# Patient Record
Sex: Female | Born: 1935 | Race: Black or African American | Hispanic: No | Smoking: Former smoker
Health system: Southern US, Community
[De-identification: ages and names within clinical notes are randomized; demographics above are authoritative.]

## PROBLEM LIST (undated history)

## (undated) DIAGNOSIS — Z8719 Personal history of other diseases of the digestive system: Secondary | ICD-10-CM

## (undated) DIAGNOSIS — F419 Anxiety disorder, unspecified: Secondary | ICD-10-CM

## (undated) DIAGNOSIS — E039 Hypothyroidism, unspecified: Secondary | ICD-10-CM

## (undated) DIAGNOSIS — I10 Essential (primary) hypertension: Secondary | ICD-10-CM

## (undated) DIAGNOSIS — R42 Dizziness and giddiness: Secondary | ICD-10-CM

## (undated) DIAGNOSIS — J449 Chronic obstructive pulmonary disease, unspecified: Secondary | ICD-10-CM

---

## 2001-02-11 ENCOUNTER — Emergency Department (HOSPITAL_COMMUNITY): Admission: EM | Admit: 2001-02-11 | Discharge: 2001-02-11 | Payer: Self-pay | Admitting: *Deleted

## 2001-02-11 ENCOUNTER — Encounter: Payer: Self-pay | Admitting: *Deleted

## 2002-06-28 ENCOUNTER — Emergency Department (HOSPITAL_COMMUNITY): Admission: EM | Admit: 2002-06-28 | Discharge: 2002-06-28 | Payer: Self-pay | Admitting: *Deleted

## 2011-10-04 ENCOUNTER — Encounter (HOSPITAL_COMMUNITY): Payer: Self-pay | Admitting: Pharmacy Technician

## 2011-10-05 NOTE — Patient Instructions (Addendum)
Your procedure is scheduled on:  10/11/2011  Report to [facility] at  6:15    AM.  Call this number if you have problems the morning of surgery: [phone]   Remember:   Do not drink or eat food:After Midnight.    Clear liquids include soda, tea, black coffee, apple or grape juice, broth.  Take these medicines the morning of surgery with A SIP OF WATER:levothyroxine, Amlodipine, Nexium, Hydralazine, lisinopril, and use Albuterol inhaler    Do not wear jewelry, make-up or nail polish.  Do not wear lotions, powders, or perfumes. You may wear deodorant.  Do not shave 48 hours prior to surgery.  Do not bring valuables to the hospital.  Contacts, dentures or bridgework may not be worn into surgery.  Leave suitcase in the car. After surgery it may be brought to your room.  For patients admitted to the hospital, checkout time is 11:00 AM the day of discharge.   Patients discharged the day of surgery will not be allowed to drive home.  Name and phone number of your driver:   Special Instructions: CHG Shower use special wash: 1/2 bottle night before surgery and 1/2 bottle morning of surgery.   Please read over the following fact sheets that you were given: Pain Booklet, MRSA  Surgical Site Infection Prevention, Anesthesia Post-op Instructions and Care and Recovery After Sur  gery  Carpal Tunnel Release (Repair) Care After Refer to this sheet in the next few weeks. These discharge instructions provide you with general information on caring for yourself after you leave the hospital. Your caregiver may also give you specific instructions. Your treatment has been planned according to the most current medical practices available, but unavoidable complications sometimes occur. If you have any problems or questions after discharge, please call your caregiver. HOME CARE INSTRUCTIONS   Have a responsible person with you for 24 hours.   Do not drive a car or take public transportation for 24 hours.    Only take over-the-counter or prescription medicines for pain, discomfort, or fever as directed by your caregiver. Take them as directed.   You may put ice on the palm side of the affected wrist.   Put ice in a plastic bag.   Place a towel between your skin and the bag.   Leave the ice on for 15 to 20 minutes, 3 to 4 times per day.   If you were given a splint to keep your wrist from bending, use it as directed. It is important to wear the splint at night or as directed. Use the splint for as long as you have pain or numbness in your hand, arm, or wrist. This may take 1 to 2 months.   Keep your hand raised (elevated) above the level of your heart as much as possible. This keeps swelling down and helps with discomfort.   Change bandages (dressings) as directed.   Keep the wound clean and dry.  SEEK MEDICAL CARE IF:   You develop pain not relieved with medicines.   You develop numbness of your hand.   You develop bleeding from your surgical site.   You have an oral temperature above 102 F (38.9 C).   You develop redness or swelling of the surgical site.   You develop new, unexplained problems.  SEEK IMMEDIATE MEDICAL CARE IF:   You develop a rash.   You have difficulty breathing.   You develop any reaction or side effects to medicines given.  MAKE SURE YOU:  Understand these instructions.   Will watch your condition.   Will get help right away if you are not doing well or get worse.  Document Released: 08/27/2004 Document Revised: 01/27/2011 Document Reviewed: 12/14/2006 [facility] Patient Information 2012 [place], [place].

## 2011-10-06 ENCOUNTER — Other Ambulatory Visit: Payer: Self-pay

## 2011-10-06 ENCOUNTER — Encounter (HOSPITAL_COMMUNITY)
Admission: RE | Admit: 2011-10-06 | Discharge: 2011-10-06 | Disposition: A | Discharge status: Home / Self Care | Payer: Medicare Other | Source: Ambulatory Visit | Attending: Orthopaedic Surgery | Admitting: Orthopaedic Surgery

## 2011-10-06 ENCOUNTER — Encounter (HOSPITAL_COMMUNITY): Payer: Self-pay

## 2011-10-06 HISTORY — DX: Essential (primary) hypertension: I10

## 2011-10-06 HISTORY — DX: Chronic obstructive pulmonary disease, unspecified: J44.9

## 2011-10-06 HISTORY — DX: Personal history of other diseases of the digestive system: Z87.19

## 2011-10-06 HISTORY — DX: Hypothyroidism, unspecified: E03.9

## 2011-10-06 HISTORY — DX: Anxiety disorder, unspecified: F41.9

## 2011-10-06 HISTORY — DX: Dizziness and giddiness: R42

## 2011-10-06 LAB — URINALYSIS, ROUTINE W REFLEX MICROSCOPIC
Bilirubin Urine: NEGATIVE
Glucose, UA: NEGATIVE mg/dL
Hgb urine dipstick: NEGATIVE
Specific Gravity, Urine: 1.01 (ref 1.005–1.030)
Urobilinogen, UA: 0.2 mg/dL (ref 0.0–1.0)

## 2011-10-06 LAB — COMPREHENSIVE METABOLIC PANEL
ALT: 12 U/L (ref 0–35)
Alkaline Phosphatase: 64 U/L (ref 39–117)
BUN: 9 mg/dL (ref 6–23)
CO2: 24 mEq/L (ref 19–32)
GFR calc Af Amer: 59 mL/min — ABNORMAL LOW (ref >90)
GFR calc non Af Amer: 51 mL/min — ABNORMAL LOW (ref >90)
Glucose, Bld: 131 mg/dL — ABNORMAL HIGH (ref 70–99)
Potassium: 3.9 mEq/L (ref 3.5–5.1)
Sodium: 134 mEq/L — ABNORMAL LOW (ref 135–145)
Total Bilirubin: 0.3 mg/dL (ref 0.3–1.2)
Total Protein: 7.4 g/dL (ref 6.0–8.3)

## 2011-10-06 LAB — URINE MICROSCOPIC-ADD ON

## 2011-10-06 LAB — CBC WITH DIFFERENTIAL/PLATELET
Basophils Absolute: 0 10*3/uL (ref 0.0–0.1)
Eosinophils Relative: 2 % (ref 0–5)
HCT: 38.4 % (ref 36.0–46.0)
Lymphs Abs: 2.5 10*3/uL (ref 0.7–4.0)
MCH: 30.7 pg (ref 26.0–34.0)
MCV: 88.7 fL (ref 78.0–100.0)
Monocytes Absolute: 0.8 10*3/uL (ref 0.1–1.0)
Neutro Abs: 7.7 10*3/uL (ref 1.7–7.7)
Platelets: 271 10*3/uL (ref 150–400)
RDW: 14.1 % (ref 11.5–15.5)

## 2011-10-10 ENCOUNTER — Encounter (HOSPITAL_COMMUNITY): Payer: Self-pay | Admitting: Orthopaedic Surgery

## 2011-10-10 MED ORDER — SODIUM CHLORIDE 0.9 % IJ SOLN
INTRAMUSCULAR | Status: AC
  Filled 2011-10-10: qty 10

## 2011-10-10 NOTE — H&P (Signed)
Katie [name] is an 78 y.o. female.   Chief Complaint: My hand goes numb. HPI: She has had numbness in both hands for years getting worse.  I saw her in the office in June for a small nondisplaced fracture of the left thumb tip.  It has healed well.  She has numbness of both hand in the median nerve distribution with pain more at night.  She had EMGs done in [facility] 09/28/11 showing bilateral carpal tunnel syndrome.  She has more pain on the left side.  I have recommended release of the carpal tunnel.  She asked appropriate questions and is agreeable to the elective procedure.  I went over risks and imponderables with her.  Past Medical History  Diagnosis Date  . COPD (chronic obstructive pulmonary disease)   . Anxiety   . Hypertension   . Hypothyroidism   . H/O hiatal hernia   . Vertigo     No past surgical history on file.  No family history on file. Social History:  reports that she quit smoking about 19 years ago. Her smoking use included Cigarettes. She has a 5 pack-year smoking history. She does not have any smokeless tobacco history on file. She reports that she does not drink alcohol or use illicit drugs.  Allergies: No Known Allergies  No prescriptions prior to admission    No results found for this or any previous visit (from the past 48 hour(s)). No results found.  Review of Systems  Constitutional: Negative.   HENT: Negative.   Eyes: Negative.   Respiratory:       History of COPD  Cardiovascular:       History of hypertension  Gastrointestinal: Negative.   Genitourinary: Negative.   Musculoskeletal: Positive for joint pain (pain both hand with numbness, more on the left).  Neurological: Negative.   Endo/Heme/Allergies:       History of hypothyroid.  Psychiatric/Behavioral: Negative.     There were no vitals taken for this visit. Physical Exam  Constitutional: She is oriented to person, place, and time. She appears well-developed and well-nourished.    HENT:  Head: Normocephalic and atraumatic.  Eyes: EOM are normal. Pupils are equal, round, and reactive to light.  Neck: Normal range of motion. Neck supple.  Cardiovascular: Normal rate, regular rhythm, normal heart sounds and intact distal pulses.   Respiratory: Effort normal and breath sounds normal.  GI: Soft. Bowel sounds are normal.  Musculoskeletal: She exhibits tenderness (Pain both wrists with positive Tinel and Phalen signs bilaterally).       Left hand: She exhibits tenderness. decreased sensation noted. Decreased sensation is present in the medial distribution.       Hands: Neurological: She is alert and oriented to person, place, and time. She has normal reflexes.  Skin: Skin is warm and dry.  Psychiatric: She has a normal mood and affect. Her behavior is normal. Judgment and thought content normal.     Assessment/Plan Carpal tunnel syndrome, bilaterally.  To do left side carpal tunnel release as an outpatient.  Chauncey Sciulli 10/10/2011, 11:44 AM

## 2011-10-11 ENCOUNTER — Ambulatory Visit (HOSPITAL_COMMUNITY): Payer: Medicare Other | Admitting: Anesthesiology

## 2011-10-11 ENCOUNTER — Encounter (HOSPITAL_COMMUNITY)
Admission: RE | Disposition: A | Discharge status: Home / Self Care | Payer: Self-pay | Source: Ambulatory Visit | Attending: Orthopaedic Surgery

## 2011-10-11 ENCOUNTER — Encounter (HOSPITAL_COMMUNITY): Payer: Self-pay | Admitting: Anesthesiology

## 2011-10-11 ENCOUNTER — Encounter (HOSPITAL_COMMUNITY): Payer: Self-pay | Admitting: *Deleted

## 2011-10-11 ENCOUNTER — Ambulatory Visit (HOSPITAL_COMMUNITY)
Admission: RE | Admit: 2011-10-11 | Discharge: 2011-10-11 | Disposition: A | Discharge status: Home / Self Care | Payer: Medicare Other | Source: Ambulatory Visit | Attending: Orthopaedic Surgery | Admitting: Orthopaedic Surgery

## 2011-10-11 DIAGNOSIS — J4489 Other specified chronic obstructive pulmonary disease: Secondary | ICD-10-CM | POA: Insufficient documentation

## 2011-10-11 DIAGNOSIS — I10 Essential (primary) hypertension: Secondary | ICD-10-CM | POA: Insufficient documentation

## 2011-10-11 DIAGNOSIS — Z01810 Encounter for preprocedural cardiovascular examination: Secondary | ICD-10-CM | POA: Insufficient documentation

## 2011-10-11 DIAGNOSIS — G5600 Carpal tunnel syndrome, unspecified upper limb: Secondary | ICD-10-CM | POA: Insufficient documentation

## 2011-10-11 DIAGNOSIS — J449 Chronic obstructive pulmonary disease, unspecified: Secondary | ICD-10-CM | POA: Insufficient documentation

## 2011-10-11 HISTORY — PX: CARPAL TUNNEL RELEASE: SHX101

## 2011-10-11 SURGERY — CARPAL TUNNEL RELEASE
Anesthesia: Regional | Site: Wrist | Laterality: Left | Wound class: Clean

## 2011-10-11 MED ORDER — MIDAZOLAM HCL 2 MG/2ML IJ SOLN
1.0000 mg | INTRAMUSCULAR | Status: DC | PRN
  Administered 2011-10-11: 2 mg via INTRAVENOUS

## 2011-10-11 MED ORDER — ONDANSETRON HCL 4 MG/2ML IJ SOLN: 4.0000 mg | Freq: Once | INTRAMUSCULAR | Status: DC | PRN

## 2011-10-11 MED ORDER — FENTANYL CITRATE 0.05 MG/ML IJ SOLN
INTRAMUSCULAR | Status: AC
  Filled 2011-10-11: qty 2

## 2011-10-11 MED ORDER — LIDOCAINE HCL (PF) 1 % IJ SOLN
INTRAMUSCULAR | Status: AC
  Filled 2011-10-11: qty 5

## 2011-10-11 MED ORDER — LIDOCAINE HCL (PF) 0.5 % IJ SOLN: INTRAMUSCULAR | Status: DC | PRN

## 2011-10-11 MED ORDER — TRAMADOL HCL 50 MG PO TABS: 50.0000 mg | ORAL_TABLET | Freq: Four times a day (QID) | ORAL | Status: AC | PRN

## 2011-10-11 MED ORDER — SODIUM CHLORIDE 0.9 % IR SOLN
Status: DC | PRN
  Administered 2011-10-11: 1000 mL

## 2011-10-11 MED ORDER — FENTANYL CITRATE 0.05 MG/ML IJ SOLN
INTRAMUSCULAR | Status: DC | PRN
  Administered 2011-10-11 (×4): 25 ug via INTRAVENOUS

## 2011-10-11 MED ORDER — SODIUM CHLORIDE 0.9 % IJ SOLN
INTRAMUSCULAR | Status: DC | PRN
  Administered 2011-10-11: 2 mL via INTRAVENOUS

## 2011-10-11 MED ORDER — PROPOFOL 10 MG/ML IV EMUL
INTRAVENOUS | Status: DC | PRN
  Administered 2011-10-11: 35 ug/kg/min via INTRAVENOUS

## 2011-10-11 MED ORDER — PROPOFOL 10 MG/ML IV EMUL
INTRAVENOUS | Status: AC
  Filled 2011-10-11: qty 20

## 2011-10-11 MED ORDER — MIDAZOLAM HCL 2 MG/2ML IJ SOLN
INTRAMUSCULAR | Status: AC
  Filled 2011-10-11: qty 2

## 2011-10-11 MED ORDER — LACTATED RINGERS IV SOLN: INTRAVENOUS | Status: DC

## 2011-10-11 MED ORDER — LACTATED RINGERS IV SOLN
INTRAVENOUS | Status: DC
  Administered 2011-10-11: 1000 mL via INTRAVENOUS

## 2011-10-11 MED ORDER — LIDOCAINE HCL (PF) 0.5 % IJ SOLN
INTRAMUSCULAR | Status: DC | PRN
  Administered 2011-10-11: 250 mg via INTRAVENOUS

## 2011-10-11 MED ORDER — FENTANYL CITRATE 0.05 MG/ML IJ SOLN: 25.0000 ug | INTRAMUSCULAR | Status: DC | PRN

## 2011-10-11 MED ORDER — LIDOCAINE HCL (PF) 0.5 % IJ SOLN
INTRAMUSCULAR | Status: AC
  Filled 2011-10-11: qty 50

## 2011-10-11 SURGICAL SUPPLY — 37 items
BAG HAMPER (MISCELLANEOUS) ×2 IMPLANT
BANDAGE ELASTIC 3 VELCRO NS (GAUZE/BANDAGES/DRESSINGS) ×2 IMPLANT
BANDAGE ESMARK 4X12 BL STRL LF (DISPOSABLE) ×1 IMPLANT
BLADE SURG 15 STRL LF DISP TIS (BLADE) ×1 IMPLANT
BLADE SURG 15 STRL SS (BLADE) ×1
BNDG ESMARK 4X12 BLUE STRL LF (DISPOSABLE) ×2
CLOTH BEACON ORANGE TIMEOUT ST (SAFETY) ×2 IMPLANT
COVER LIGHT HANDLE STERIS (MISCELLANEOUS) ×4 IMPLANT
CUFF TOURNIQUET SINGLE 18IN (TOURNIQUET CUFF) ×2 IMPLANT
DRSG XEROFORM 1X8 (GAUZE/BANDAGES/DRESSINGS) ×4 IMPLANT
DURAPREP 26ML APPLICATOR (WOUND CARE) ×2 IMPLANT
ELECT NEEDLE TIP 2.8 STRL (NEEDLE) ×2 IMPLANT
ELECT REM PT RETURN 9FT ADLT (ELECTROSURGICAL) ×2
ELECTRODE REM PT RTRN 9FT ADLT (ELECTROSURGICAL) ×1 IMPLANT
FORMALIN 10 PREFIL 120ML (MISCELLANEOUS) ×2 IMPLANT
GLOVE BIO SURGEON STRL SZ8 (GLOVE) ×2 IMPLANT
GLOVE BIO SURGEON STRL SZ8.5 (GLOVE) ×2 IMPLANT
GLOVE BIOGEL PI IND STRL 7.0 (GLOVE) ×1 IMPLANT
GLOVE BIOGEL PI INDICATOR 7.0 (GLOVE) ×1
GLOVE EXAM NITRILE MD LF STRL (GLOVE) ×2 IMPLANT
GLOVE SS BIOGEL STRL SZ 6.5 (GLOVE) ×1 IMPLANT
GLOVE SUPERSENSE BIOGEL SZ 6.5 (GLOVE) ×1
GOWN STRL REIN XL XLG (GOWN DISPOSABLE) ×4 IMPLANT
KIT ROOM TURNOVER APOR (KITS) ×2 IMPLANT
NEEDLE HYPO 18GX1.5 BLUNT FILL (NEEDLE) ×2 IMPLANT
NEEDLE HYPO 27GX1-1/4 (NEEDLE) ×2 IMPLANT
NS IRRIG 1000ML POUR BTL (IV SOLUTION) ×2 IMPLANT
PACK BASIC LIMB (CUSTOM PROCEDURE TRAY) ×2 IMPLANT
PAD ARMBOARD 7.5X6 YLW CONV (MISCELLANEOUS) ×2 IMPLANT
PAD CAST 3X4 CTTN HI CHSV (CAST SUPPLIES) ×1 IMPLANT
PADDING CAST COTTON 3X4 STRL (CAST SUPPLIES) ×1
SET BASIN LINEN APH (SET/KITS/TRAYS/PACK) ×2 IMPLANT
SPONGE GAUZE 4X4 12PLY (GAUZE/BANDAGES/DRESSINGS) ×2 IMPLANT
SUT ETHILON 3 0 FSL (SUTURE) ×2 IMPLANT
SYR 3ML LL SCALE MARK (SYRINGE) ×2 IMPLANT
TOWEL OR 17X26 4PK STRL BLUE (TOWEL DISPOSABLE) IMPLANT
VESSEL LOOPS MAXI RED (MISCELLANEOUS) ×2 IMPLANT

## 2011-10-11 NOTE — Brief Op Note (Signed)
10/11/2011  8:11 AM  PATIENT:  Katie [name]  78 y.o. female  PRE-OPERATIVE DIAGNOSIS:  carpal tunnel syndrome left wrist  POST-OPERATIVE DIAGNOSIS:  carpal tunnel syndrome left wrist  PROCEDURE:  Procedure(s) (LRB): CARPAL TUNNEL RELEASE (Left)  SURGEON:  Surgeon(s) and Role:    * Darreld Mclean, MD - Primary  PHYSICIAN ASSISTANT:   ASSISTANTS: none   ANESTHESIA:   regional  EBL:  Total I/O In: 600 [I.V.:600] Out: -   BLOOD ADMINISTERED:none  DRAINS: none   LOCAL MEDICATIONS USED:  NONE  SPECIMEN:  Source of Specimen:  left volar carpal ligament  DISPOSITION OF SPECIMEN:  PATHOLOGY  COUNTS:  YES  TOURNIQUET:   Total Tourniquet Time Documented: Upper Arm (Left) - 30 minutes  DICTATION: .Other Dictation: Dictation Number [id number]  PLAN OF CARE: Discharge to home after PACU  PATIENT DISPOSITION:  PACU - hemodynamically stable.   Delay start of Pharmacological VTE agent (>24hrs) due to surgical blood loss or risk of bleeding: not applicable

## 2011-10-11 NOTE — Progress Notes (Signed)
The History and Physical is unchanged. I have examined the patient. The patient is medically able to have surgery on the left wrist for carpal tunnel . Katie [name]

## 2011-10-11 NOTE — Anesthesia Postprocedure Evaluation (Signed)
  Anesthesia Post-op Note  Patient: Katie [name]  Procedure(s) Performed: Procedure(s) (LRB): CARPAL TUNNEL RELEASE (Left)  Patient Location: PACU  Anesthesia Type: Bier block  Level of Consciousness: awake, alert , oriented and patient cooperative  Airway and Oxygen Therapy: Patient Spontanous Breathing  Post-op Pain: none  Post-op Assessment: Post-op Vital signs reviewed, Patient's Cardiovascular Status Stable, Respiratory Function Stable, Patent Airway, No signs of Nausea or vomiting and Pain level controlled  Post-op Vital Signs: Reviewed and stable  Complications: No apparent anesthesia complications

## 2011-10-11 NOTE — Anesthesia Preprocedure Evaluation (Signed)
Anesthesia Evaluation  Patient identified by MRN, date of birth, ID band Patient awake    Reviewed: Allergy & Precautions, H&P , NPO status , Patient's Chart, lab work & pertinent test results  Airway Mallampati: II      Dental  (+) Edentulous Upper   Pulmonary COPD COPD inhaler, former smoker,  breath sounds clear to auscultation        Cardiovascular hypertension, Pt. on medications Rhythm:Regular     Neuro/Psych Anxiety    GI/Hepatic hiatal hernia,   Endo/Other  Hypothyroidism   Renal/GU      Musculoskeletal   Abdominal   Peds  Hematology   Anesthesia Other Findings   Reproductive/Obstetrics                           Anesthesia Physical Anesthesia Plan  ASA: III  Anesthesia Plan: Bier Block   Post-op Pain Management:    Induction: Intravenous  Airway Management Planned: Nasal Cannula  Additional Equipment:   Intra-op Plan:   Post-operative Plan:   Informed Consent: I have reviewed the patients History and Physical, chart, labs and discussed the procedure including the risks, benefits and alternatives for the proposed anesthesia with the patient or authorized representative who has indicated his/her understanding and acceptance.     Plan Discussed with:   Anesthesia Plan Comments:         Anesthesia Quick Evaluation

## 2011-10-11 NOTE — Anesthesia Procedure Notes (Signed)
Anesthesia Regional Block:  Bier block (IV Regional)  Pre-Anesthetic Checklist: ,,, Correct Patient, Correct Site, Correct Laterality, Correct Procedure, Correct Position, site marked, risks and benefits discussed, Surgical consent, Pre-op evaluation  Laterality: Left  Prep: Betadine       Needles:  Injection technique: Single-shot      Additional Needles:  Motor weakness within 2 minutes. Bier block (IV Regional) Narrative:  Resident/CRNA: R. Jaivon Vanbeek,crna

## 2011-10-11 NOTE — Transfer of Care (Signed)
Immediate Anesthesia Transfer of Care Note  Patient: Katie [name]  Procedure(s) Performed: Procedure(s) (LRB): CARPAL TUNNEL RELEASE (Left)  Patient Location: PACU  Anesthesia Type: Bier block  Level of Consciousness: awake and patient cooperative  Airway & Oxygen Therapy: Patient Spontanous Breathing and Patient connected to face mask oxygen  Post-op Assessment: Report given to PACU RN, Post -op Vital signs reviewed and stable and Patient moving all extremities  Post vital signs: Reviewed and stable  Complications: No apparent anesthesia complications

## 2011-10-11 NOTE — Addendum Note (Signed)
Addendum  created 10/11/11 0948 by Despina Hidden, CRNA   Modules edited:Anesthesia Medication Administration

## 2011-10-12 NOTE — Op Note (Signed)
NAME:  Katie, [name] NO.:  [id number]  MEDICAL RECORD NO.:  [id number]  LOCATION:  [facility]                          FACILITY:  [facility]  PHYSICIAN:  J. Darreld Mclean, M.D. DATE OF BIRTH:  1924/07/18  DATE OF PROCEDURE: DATE OF DISCHARGE:  10/11/2011                              OPERATIVE REPORT   PREOPERATIVE DIAGNOSIS:  Carpal tunnel syndrome, left.  POSTOPERATIVE DIAGNOSIS:  Carpal tunnel syndrome, left.  PROCEDURE:  Open release of volar carpal ligament, left; saline neurolysis, aponeurotomy, left median nerve.  ANESTHESIA:  Regional Bier block.  Please refer to anesthesia record for tourniquet time.  SURGEON:  J. Darreld Mclean, M.D.  DRAINS:  No drains.  SPLINTS:  No splint.  INDICATIONS:  The patient is an 78 year old female with pain and tenderness in both hands with weakness and numbness in both hands and median nerve distribution.  She has had EMG showing bilateral carpal tunnel.  Her symptoms are worse on the left and on the right.  She has not improve with conservative treatment.  I recommended surgical release.  I went over the risks and imponderables with her and she appears to understand and agrees to procedure as outlined.  PROCEDURE:  The patient was seen in the holding area and identified the left wrist as correct surgical site.  I placed a mark on the volar left wrist over the site of the planned incision.  She was brought to the operating room and placed supine.  She had Bier block regional anesthesia given.  She was prepped and draped in usual manner.  A generalized time-out, identifying [name] as correct patient care.  We are doing a left wrist carpal tunnel release.  All instrumentation were properly positioned and working.  The OR team knew each other.  Outline for incision was made with careful dissection, the median nerve was identified proximally.  The vessel loop placed was around the nerve. Then using a groove  director was placed on the carpal tunnel space and volar carpal ligament was incised.  Ligament was released retinaculum proximally.  Saline neurolysis aponeurotomy was carried to the nerve was markedly compressed.  There was suspected no apparent injury.  The wounds were reapproximated with using 3-0 nylon in interrupted vertical mattress manner.  Sterile dressing applied.  Bulky dressing applied. Sheet cotton applied.  Sheet cotton cut dorsally.  ACE bandage applied loosely.  The patient tolerated the procedure well.  She will go to the recovery in good condition.  Appropriate analgesia was given for pain. I will see her in the office in 1 week.  If any difficulties, should contact me through the office hospital beeper system.          ______________________________ Shela Commons. Darreld Mclean, M.D.     JWK/MEDQ  D:  10/11/2011  T:  10/12/2011  Job:  [id number]

## 2011-10-14 ENCOUNTER — Encounter (HOSPITAL_COMMUNITY): Payer: Self-pay | Admitting: Orthopaedic Surgery

## 2017-02-03 ENCOUNTER — Other Ambulatory Visit: Payer: Self-pay | Admitting: Ophthalmology

## 2017-02-03 ENCOUNTER — Other Ambulatory Visit
Admission: RE | Admit: 2017-02-03 | Discharge: 2017-02-03 | Disposition: A | Discharge status: Home / Self Care | Payer: Medicare Other | Source: Ambulatory Visit | Attending: Orthopedic Surgery | Admitting: Orthopedic Surgery

## 2017-02-03 DIAGNOSIS — H3411 Central retinal artery occlusion, right eye: Secondary | ICD-10-CM | POA: Insufficient documentation

## 2017-02-03 LAB — CBC WITH DIFFERENTIAL/PLATELET
BASOS PCT: 0 %
Basophils Absolute: 0 10*3/uL (ref 0–0.1)
EOS ABS: 0.1 10*3/uL (ref 0–0.7)
Eosinophils Relative: 1 %
HCT: 38.2 % (ref 35.0–47.0)
HEMOGLOBIN: 12.5 g/dL (ref 12.0–16.0)
Lymphocytes Relative: 11 %
Lymphs Abs: 1.2 10*3/uL (ref 1.0–3.6)
MCH: 29.5 pg (ref 26.0–34.0)
MCHC: 32.8 g/dL (ref 32.0–36.0)
MCV: 89.9 fL (ref 80.0–100.0)
MONOS PCT: 8 %
Monocytes Absolute: 0.8 10*3/uL (ref 0.2–0.9)
NEUTROS PCT: 80 %
Neutro Abs: 8.7 10*3/uL — ABNORMAL HIGH (ref 1.4–6.5)
PLATELETS: 232 10*3/uL (ref 150–440)
RBC: 4.25 MIL/uL (ref 3.80–5.20)
RDW: 16.2 % — ABNORMAL HIGH (ref 11.5–14.5)
WBC: 10.8 10*3/uL (ref 3.6–11.0)

## 2017-02-03 LAB — C-REACTIVE PROTEIN: CRP: 2.7 mg/dL — AB (ref <1.0)

## 2017-02-03 LAB — SEDIMENTATION RATE: SED RATE: 82 mm/h — AB (ref 0–30)

## 2017-02-06 ENCOUNTER — Ambulatory Visit
Admission: RE | Admit: 2017-02-06 | Discharge: 2017-02-06 | Disposition: A | Discharge status: Home / Self Care | Payer: Medicare Other | Source: Ambulatory Visit | Attending: Ophthalmology | Admitting: Ophthalmology

## 2017-02-06 DIAGNOSIS — I6523 Occlusion and stenosis of bilateral carotid arteries: Secondary | ICD-10-CM | POA: Insufficient documentation

## 2017-02-06 DIAGNOSIS — H3411 Central retinal artery occlusion, right eye: Secondary | ICD-10-CM | POA: Diagnosis not present

## 2017-02-23 ENCOUNTER — Encounter (INDEPENDENT_AMBULATORY_CARE_PROVIDER_SITE_OTHER): Payer: Self-pay

## 2017-02-23 ENCOUNTER — Ambulatory Visit (INDEPENDENT_AMBULATORY_CARE_PROVIDER_SITE_OTHER): Payer: Medicare Other | Admitting: Vascular Surgery

## 2017-02-23 ENCOUNTER — Encounter (INDEPENDENT_AMBULATORY_CARE_PROVIDER_SITE_OTHER): Payer: Self-pay | Admitting: Vascular Surgery

## 2017-02-23 DIAGNOSIS — M316 Other giant cell arteritis: Secondary | ICD-10-CM | POA: Insufficient documentation

## 2017-02-23 DIAGNOSIS — J449 Chronic obstructive pulmonary disease, unspecified: Secondary | ICD-10-CM | POA: Diagnosis not present

## 2017-02-23 DIAGNOSIS — H5440 Blindness, one eye, unspecified eye: Secondary | ICD-10-CM

## 2017-02-23 DIAGNOSIS — I6523 Occlusion and stenosis of bilateral carotid arteries: Secondary | ICD-10-CM | POA: Diagnosis not present

## 2017-02-23 DIAGNOSIS — I10 Essential (primary) hypertension: Secondary | ICD-10-CM

## 2017-02-23 DIAGNOSIS — I6529 Occlusion and stenosis of unspecified carotid artery: Secondary | ICD-10-CM | POA: Insufficient documentation

## 2017-02-24 ENCOUNTER — Encounter (INDEPENDENT_AMBULATORY_CARE_PROVIDER_SITE_OTHER): Payer: Self-pay | Admitting: Vascular Surgery

## 2017-02-24 DIAGNOSIS — I10 Essential (primary) hypertension: Secondary | ICD-10-CM | POA: Insufficient documentation

## 2017-02-24 DIAGNOSIS — J449 Chronic obstructive pulmonary disease, unspecified: Secondary | ICD-10-CM | POA: Insufficient documentation

## 2017-02-24 NOTE — Progress Notes (Signed)
MRN : [id number]  Curt BearsGirtherene W Kuzel is a 84 y.o. (July 26, 1924) female who presents with chief complaint of  Chief Complaint  Patient presents with  . New Patient (Initial Visit)    ref [place] for CAS  .  History of Present Illness: The patient is seen for evaluation of carotid stenosis. The carotid stenosis was identified after he was evaluated at [facility].  The patient experienced an acute onset of loss of vision in the right eye.  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  This then prompted a duplex ultrasound which demonstrates moderate to possible severe atherosclerotic occlusive disease of the carotid arteries.  CRP and sedimentation rate were also ordered and these were both elevated.  The patient denies past episodes of amaurosis fugax. There is no recent history of TIA symptoms or focal motor deficits. There is no prior documented CVA.  There is no history of migraine headaches. There is no history of seizures.  The patient is taking enteric-coated aspirin 81 mg daily.  The patient has a history of coronary artery disease, no recent episodes of angina or shortness of breath. The patient denies PAD or claudication symptoms. There is a history of hyperlipidemia which is being treated with a statin.    Current Meds  Medication Sig  . acetaminophen (TYLENOL) 500 MG tablet Take 500 mg by mouth every 6 (six) hours as needed. For pain  . albuterol (PROVENTIL HFA;VENTOLIN HFA) 108 (90 BASE) MCG/ACT inhaler Inhale 2 puffs into the lungs every 6 (six) hours as needed. For shortness of breath  . amLODipine (NORVASC) 10 MG tablet Take 10 mg by mouth daily.  Marland Kitchen. aspirin EC 81 MG tablet Take 325 mg by mouth daily.   . diazepam (VALIUM) 5 MG tablet Take 2.5 mg by mouth at bedtime as needed. For sleep  . esomeprazole (NEXIUM) 40 MG capsule Take 40 mg by mouth daily before breakfast.  . furosemide (LASIX) 20 MG tablet Take 10 mg by mouth  daily.  . hydrALAZINE (APRESOLINE) 10 MG tablet Take 10 mg by mouth daily.  . hydrochlorothiazide (HYDRODIURIL) 25 MG tablet Take 25 mg by mouth daily.  Marland Kitchen. levothyroxine (SYNTHROID, LEVOTHROID) 75 MCG tablet Take 75 mcg by mouth daily.  Marland Kitchen. lisinopril (PRINIVIL,ZESTRIL) 20 MG tablet Take 10 mg by mouth daily.  . meclizine (ANTIVERT) 25 MG tablet Take 25 mg by mouth 3 (three) times daily as needed. For inner ear    Past Medical History:  Diagnosis Date  . Anxiety   . COPD (chronic obstructive pulmonary disease) (HCC)   . H/O hiatal hernia   . Hypertension   . Hypothyroidism   . Vertigo     Past Surgical History:  Procedure Laterality Date  . CARPAL TUNNEL RELEASE  10/11/2011   Procedure: CARPAL TUNNEL RELEASE;  Surgeon: Darreld McleanWayne Keeling, MD;  Location: [facility];  Service: Orthopedics;  Laterality: Left;    Social History Social History   Tobacco Use  . Smoking status: Former Smoker    Packs/day: 0.50    Years: 10.00    Pack years: 5.00    Types: Cigarettes    Last attempt to quit: 10/05/1992    Years since quitting: 24.4  . Smokeless tobacco: Never Used  Substance Use Topics  . Alcohol use: No  . Drug use: No    Family History Family History  Problem Relation Age of Onset  . Hypertension Daughter   . Hypertension Son   No family history  of bleeding/clotting disorders, porphyria or autoimmune disease   No Known Allergies   REVIEW OF SYSTEMS (Negative unless checked)  Constitutional: [] Weight loss  [] Fever  [] Chills Cardiac: [] Chest pain   [] Chest pressure   [] Palpitations   [] Shortness of breath when laying flat   [] Shortness of breath with exertion. Vascular:  [] Pain in legs with walking   [] Pain in legs at rest  [] History of DVT   [] Phlebitis   [] Swelling in legs   [] Varicose veins   [] Non-healing ulcers Pulmonary:   [] Uses home oxygen   [] Productive cough   [] Hemoptysis   [] Wheeze  [x] COPD   [] Asthma Neurologic:  [] Dizziness   [] Seizures   [] History of stroke    [] History of TIA  [] Aphasia   [x] Vissual changes   [] Weakness or numbness in arm   [] Weakness or numbness in leg Musculoskeletal:   [] Joint swelling   [] Joint pain   [] Low back pain Hematologic:  [] Easy bruising  [] Easy bleeding   [] Hypercoagulable state   [] Anemic Gastrointestinal:  [] Diarrhea   [] Vomiting  [] Gastroesophageal reflux/heartburn   [] Difficulty swallowing. Genitourinary:  [] Chronic kidney disease   [] Difficult urination  [] Frequent urination   [] Blood in urine Skin:  [] Rashes   [] Ulcers  Psychological:  [] History of anxiety   []  History of major depression.  Physical Examination  Vitals:   02/23/17 1014  BP: (!) 190/74  Pulse: 76  Resp: 16  Weight: 202 lb (91.6 kg)  Height: 5\' 6"  (1.676 m)   Body mass index is 32.6 kg/m. Gen: WD/WN, NAD Head: [place]/AT, No temporalis wasting.  Ear/Nose/Throat: Hearing grossly intact, nares w/o erythema or drainage, poor dentition Eyes: PER, EOMI, sclera nonicteric.  Neck: Supple, no masses.  No bruit or JVD.  Pulmonary:  Good air movement, clear to auscultation bilaterally, no use of accessory muscles.  Cardiac: RRR, normal S1, S2, no Murmurs. Vascular: No carotid bruits Vessel Right Left  Radial Palpable Palpable  Ulnar Palpable Palpable  Brachial Palpable Palpable  Carotid Palpable Palpable  Gastrointestinal: soft, non-distended. No guarding/no peritoneal signs.  Musculoskeletal: M/S 5/5 throughout.  No deformity or atrophy.  Neurologic: CN 2-12 intact. Pain and light touch intact in extremities.  Symmetrical.  Speech is fluent. Motor exam as listed above. Psychiatric: Judgment intact, Mood & affect appropriate for pt's clinical situation. Dermatologic: No rashes or ulcers noted.  No changes consistent with cellulitis. Lymph : No Cervical lymphadenopathy, no lichenification or skin changes of chronic lymphedema.  CBC Lab Results  Component Value Date   WBC 10.8 02/03/2017   HGB 12.5 02/03/2017   HCT 38.2 02/03/2017   MCV 89.9  02/03/2017   PLT 232 02/03/2017    BMET    Component Value Date/Time   NA 134 (L) 10/06/2011 1340   K 3.9 10/06/2011 1340   CL 99 10/06/2011 1340   CO2 24 10/06/2011 1340   GLUCOSE 131 (H) 10/06/2011 1340   BUN 9 10/06/2011 1340   CREATININE 0.98 10/06/2011 1340   CALCIUM 10.4 10/06/2011 1340   GFRNONAA 51 (L) 10/06/2011 1340   GFRAA 59 (L) 10/06/2011 1340   CrCl cannot be calculated (Patient's most recent lab result is older than the maximum 21 days allowed.).  COAG No results found for: INR, PROTIME  Radiology US Carotid Bilateral  Result Date: 02/06/2017 CLINICAL DATA:  84 year old female with a history of eye changes. Cardiovascular risk factors include hypertension EXAM: BILATERAL CAROTID DUPLEX ULTRASOUND TECHNIQUE: Wallace Cullens scale imaging, color Doppler and duplex ultrasound were performed of bilateral carotid and vertebral  arteries in the neck. COMPARISON:  None FINDINGS: Criteria: Quantification of carotid stenosis is based on velocity parameters that correlate the residual internal carotid diameter with NASCET-based stenosis levels, using the diameter of the distal internal carotid lumen as the denominator for stenosis measurement. The following velocity measurements were obtained: RIGHT ICA:  Systolic 97 cm/sec, Diastolic 19 cm/sec CCA:  50 cm/sec SYSTOLIC ICA/CCA RATIO:  2.0 ECA:  276 cm/sec LEFT ICA:  Systolic 94 cm/sec, Diastolic 15 cm/sec CCA:  84 cm/sec SYSTOLIC ICA/CCA RATIO:  1.1 ECA:  187 cm/sec Right Brachial SBP: Not acquired Left Brachial SBP: Not acquired RIGHT CAROTID ARTERY: No significant calcifications of the right common carotid artery. Intermediate waveform maintained. Heterogeneous and partially calcified plaque at the right carotid bifurcation. Lumen shadowing. Low resistance waveform of the right ICA. No significant tortuosity. RIGHT VERTEBRAL ARTERY: Antegrade flow with low resistance waveform. LEFT CAROTID ARTERY: No significant calcifications of the left  common carotid artery. Intermediate waveform maintained. Heterogeneous and partially calcified plaque at the left carotid bifurcation with lumen shadowing. Low resistance waveform of the left ICA. No significant tortuosity. LEFT VERTEBRAL ARTERY:  Antegrade flow with low resistance waveform. IMPRESSION: Right: Heterogeneous and partially calcified plaque at the right carotid bifurcation, with discordant results regarding degree of stenosis by established duplex criteria. Peak velocity suggests no significant stenosis, with the ICA/ CCA ratio suggesting 50% -69% stenosis. Also, note that velocities of the right ICA were obtained from an area distal to the maximum narrowing due to the presence of anterior wall plaque with shadowing and may be underestimating the percentage of ICA stenosis. If establishing a more accurate degree of stenosis is required, cerebral angiogram should be considered, or as a second best test, CTA. Left: Heterogeneous and partially calcified plaque at the left carotid bifurcation, with no significant stenosis by established duplex criteria. Note that flow velocities of the left ICA were obtained from an area distal to the maximum narrowing due to the presence of anterior wall plaque with shadowing and may be underestimating the percentage of ICA stenosis. If establishing a more accurate degree of stenosis is required, cerebral angiogram should be considered, or as a second best test, CTA. Signed, Yvone Neu. Loreta Ave, DO [facility] Radiology Electronically Signed   By: Gilmer Mor D.O.   On: 02/06/2017 16:39     Assessment/Plan 1. Blindness of right eye Given his elevated inflammatory markers I will ask rheumatology at [facility] clinic to see him and evaluate him.  He has been started on prednisone and he has noted significant improvement in his vision.  This would support the possibility of temporal arteritis.  I therefore will ask  rheumatology to evaluate him.  - Ambulatory referral to Rheumatology  2. Bilateral carotid artery stenosis The patient remains asymptomatic with respect to the carotid stenosis.  However, the patient has now progressed and has a lesion the is >70%.  Patient should undergo CT angiography of the carotid arteries to define the degree of stenosis of the internal carotid arteries bilaterally and the anatomic suitability for surgery vs. intervention.  If the patient does indeed need surgery cardiac clearance will be required, once cleared the patient will be scheduled for surgery.  The risks, benefits and alternative therapies were reviewed in detail with the patient.  All questions were answered.  The patient agrees to proceed with imaging.  Continue antiplatelet therapy as prescribed. Continue management of CAD, HTN and Hyperlipidemia. Healthy heart diet, encouraged exercise at least 4 times  per week.   - CT ANGIO NECK W OR WO CONTRAST; Future  3. Temporal arteritis (HCC) See #1 - Ambulatory referral to Rheumatology  4. Essential hypertension Continue antihypertensive medications as already ordered, these medications have been reviewed and there are no changes at this time.   5. Chronic obstructive pulmonary disease, unspecified COPD type (HCC) Continue pulmonary medications and aerosols as already ordered, these medications have been reviewed and there are no changes at this time.      Levora Dredge, MD  02/24/2017 8:31 AM

## 2017-03-17 ENCOUNTER — Ambulatory Visit
Admission: RE | Admit: 2017-03-17 | Discharge: 2017-03-17 | Disposition: A | Discharge status: Home / Self Care | Payer: Medicare Other | Source: Ambulatory Visit | Attending: Vascular Surgery | Admitting: Vascular Surgery

## 2017-03-17 DIAGNOSIS — I6523 Occlusion and stenosis of bilateral carotid arteries: Secondary | ICD-10-CM

## 2017-03-17 DIAGNOSIS — I70208 Unspecified atherosclerosis of native arteries of extremities, other extremity: Secondary | ICD-10-CM | POA: Diagnosis not present

## 2017-03-17 DIAGNOSIS — I700 Atherosclerosis of aorta: Secondary | ICD-10-CM | POA: Insufficient documentation

## 2017-03-17 DIAGNOSIS — E049 Nontoxic goiter, unspecified: Secondary | ICD-10-CM | POA: Insufficient documentation

## 2017-03-17 LAB — POCT I-STAT CREATININE: CREATININE: 1 mg/dL (ref 0.44–1.00)

## 2017-03-17 MED ORDER — IOPAMIDOL (ISOVUE-370) INJECTION 76%
75.0000 mL | Freq: Once | INTRAVENOUS | Status: AC | PRN
  Administered 2017-03-17: 75 mL via INTRAVENOUS

## 2017-03-27 ENCOUNTER — Ambulatory Visit (INDEPENDENT_AMBULATORY_CARE_PROVIDER_SITE_OTHER): Payer: Medicare Other | Admitting: Vascular Surgery

## 2017-03-27 ENCOUNTER — Encounter (INDEPENDENT_AMBULATORY_CARE_PROVIDER_SITE_OTHER): Payer: Self-pay | Admitting: Vascular Surgery

## 2017-03-27 VITALS — BP 184/112 | HR 67 | Resp 18 | Ht 66.0 in | Wt 203.0 lb

## 2017-03-27 DIAGNOSIS — H5440 Blindness, one eye, unspecified eye: Secondary | ICD-10-CM

## 2017-03-27 DIAGNOSIS — I6523 Occlusion and stenosis of bilateral carotid arteries: Secondary | ICD-10-CM

## 2017-03-27 DIAGNOSIS — I10 Essential (primary) hypertension: Secondary | ICD-10-CM

## 2017-03-27 DIAGNOSIS — J449 Chronic obstructive pulmonary disease, unspecified: Secondary | ICD-10-CM | POA: Diagnosis not present

## 2017-03-29 ENCOUNTER — Encounter (INDEPENDENT_AMBULATORY_CARE_PROVIDER_SITE_OTHER): Payer: Self-pay | Admitting: Vascular Surgery

## 2017-03-29 NOTE — Progress Notes (Signed)
MRN : [id number]  Curt BearsGirtherene W Lapre is a 84 y.o. (07/21/1924) female who presents with chief complaint of  Chief Complaint  Patient presents with  . Follow-up    CT results  .  History of Present Illness:The patient is seen for follow up evaluation of carotid stenosis. The carotid stenosis followed by ultrasound.   The patient denies amaurosis fugax. There is no recent history of TIA symptoms or focal motor deficits. There is no prior documented CVA.  The patient is taking enteric-coated aspirin 81 mg daily.  There is no history of migraine headaches. There is no history of seizures.  The patient has a history of coronary artery disease, no recent episodes of angina or shortness of breath. The patient denies PAD or claudication symptoms. There is a history of hyperlipidemia which is being treated with a statin.    I have personally reviewed the CT scan by my interpretation the patient has a 60-70% stenosis at the ostia of the right innominate and approximately 50% stenosis at the origin of the left common carotid there is less than 50% stenosis bilateral carotid bulbs and internal carotid arteries.  Current Meds  Medication Sig  . acetaminophen (TYLENOL) 500 MG tablet Take 500 mg by mouth every 6 (six) hours as needed. For pain  . albuterol (PROVENTIL HFA;VENTOLIN HFA) 108 (90 BASE) MCG/ACT inhaler Inhale 2 puffs into the lungs every 6 (six) hours as needed. For shortness of breath  . amLODipine (NORVASC) 10 MG tablet Take 10 mg by mouth daily.  Marland Kitchen. aspirin EC 81 MG tablet Take 325 mg by mouth daily.   . diazepam (VALIUM) 5 MG tablet Take 2.5 mg by mouth at bedtime as needed. For sleep  . esomeprazole (NEXIUM) 40 MG capsule Take 40 mg by mouth daily before breakfast.  . furosemide (LASIX) 20 MG tablet Take 10 mg by mouth daily.  . hydrALAZINE (APRESOLINE) 10 MG tablet Take 10 mg by mouth daily.  . hydrALAZINE (APRESOLINE) 25 MG tablet   . hydrochlorothiazide (HYDRODIURIL) 25  MG tablet Take 25 mg by mouth daily.  Marland Kitchen. levothyroxine (SYNTHROID, LEVOTHROID) 75 MCG tablet Take 75 mcg by mouth daily.  Marland Kitchen. lisinopril (PRINIVIL,ZESTRIL) 20 MG tablet Take 10 mg by mouth daily.  . meclizine (ANTIVERT) 25 MG tablet Take 25 mg by mouth 3 (three) times daily as needed. For inner ear  . predniSONE (DELTASONE) 5 MG tablet   . Vitamin D, Ergocalciferol, (DRISDOL) 50000 units CAPS capsule Take by mouth.    Past Medical History:  Diagnosis Date  . Anxiety   . COPD (chronic obstructive pulmonary disease) (HCC)   . H/O hiatal hernia   . Hypertension   . Hypothyroidism   . Vertigo     Past Surgical History:  Procedure Laterality Date  . CARPAL TUNNEL RELEASE  10/11/2011   Procedure: CARPAL TUNNEL RELEASE;  Surgeon: Darreld McleanWayne Keeling, MD;  Location: [facility];  Service: Orthopedics;  Laterality: Left;    Social History Social History   Tobacco Use  . Smoking status: Former Smoker    Packs/day: 0.50    Years: 10.00    Pack years: 5.00    Types: Cigarettes    Last attempt to quit: 10/05/1992    Years since quitting: 24.4  . Smokeless tobacco: Never Used  Substance Use Topics  . Alcohol use: No  . Drug use: No    Family History Family History  Problem Relation Age of Onset  . Hypertension Daughter   . Hypertension  Son     No Known Allergies   REVIEW OF SYSTEMS (Negative unless checked)  Constitutional: [] Weight loss  [] Fever  [] Chills Cardiac: [] Chest pain   [] Chest pressure   [] Palpitations   [] Shortness of breath when laying flat   [] Shortness of breath with exertion. Vascular:  [] Pain in legs with walking   [] Pain in legs at rest  [] History of DVT   [] Phlebitis   [] Swelling in legs   [] Varicose veins   [] Non-healing ulcers Pulmonary:   [] Uses home oxygen   [] Productive cough   [] Hemoptysis   [] Wheeze  [] COPD   [] Asthma Neurologic:  [] Dizziness   [] Seizures   [] History of stroke   [] History of TIA  [] Aphasia   [] Vissual changes   [] Weakness or numbness in arm    [] Weakness or numbness in leg Musculoskeletal:   [] Joint swelling   [] Joint pain   [] Low back pain Hematologic:  [] Easy bruising  [] Easy bleeding   [] Hypercoagulable state   [] Anemic Gastrointestinal:  [] Diarrhea   [] Vomiting  [] Gastroesophageal reflux/heartburn   [] Difficulty swallowing. Genitourinary:  [] Chronic kidney disease   [] Difficult urination  [] Frequent urination   [] Blood in urine Skin:  [] Rashes   [] Ulcers  Psychological:  [] History of anxiety   []  History of major depression.  Physical Examination  Vitals:   03/27/17 1619  BP: (!) 184/112  Pulse: 67  Resp: 18  Weight: 203 lb (92.1 kg)  Height: 5\' 6"  (1.676 m)   Body mass index is 32.77 kg/m. Gen: WD/WN, NAD Head: [place]/AT, No temporalis wasting.  Ear/Nose/Throat: Hearing grossly intact, nares w/o erythema or drainage Eyes: PER, EOMI, sclera nonicteric.  Neck: Supple, no large masses.   Pulmonary:  Good air movement, no audible wheezing bilaterally, no use of accessory muscles.  Cardiac: RRR, no JVD Vascular:  Vessel Right Left  Radial Palpable Palpable  Ulnar Palpable Palpable  Brachial Palpable Palpable  Carotid Palpable Palpable  Gastrointestinal: Non-distended. No guarding/no peritoneal signs.  Musculoskeletal: M/S 5/5 throughout.  No deformity or atrophy.  Neurologic: CN 2-12 intact. Symmetrical.  Speech is fluent. Motor exam as listed above. Psychiatric: Judgment intact, Mood & affect appropriate for pt's clinical situation. Dermatologic: No rashes or ulcers noted.  No changes consistent with cellulitis. Lymph : No lichenification or skin changes of chronic lymphedema.  CBC Lab Results  Component Value Date   WBC 10.8 02/03/2017   HGB 12.5 02/03/2017   HCT 38.2 02/03/2017   MCV 89.9 02/03/2017   PLT 232 02/03/2017    BMET    Component Value Date/Time   NA 134 (L) 10/06/2011 1340   K 3.9 10/06/2011 1340   CL 99 10/06/2011 1340   CO2 24 10/06/2011 1340   GLUCOSE 131 (H) 10/06/2011 1340   BUN 9  10/06/2011 1340   CREATININE 1.00 03/17/2017 1525   CALCIUM 10.4 10/06/2011 1340   GFRNONAA 51 (L) 10/06/2011 1340   GFRAA 59 (L) 10/06/2011 1340   Estimated Creatinine Clearance: 42.7 mL/min (by C-G formula based on SCr of 1 mg/dL).  COAG No results found for: INR, PROTIME  Radiology Ct Angio Neck W Or Wo Contrast  Result Date: 03/17/2017 CLINICAL DATA:  Carotid artery stenosis. EXAM: CT ANGIOGRAPHY NECK TECHNIQUE: Multidetector CT imaging of the neck was performed using the standard protocol during bolus administration of intravenous contrast. Multiplanar CT image reconstructions and MIPs were obtained to evaluate the vascular anatomy. Carotid stenosis measurements (when applicable) are obtained utilizing NASCET criteria, using the distal internal carotid diameter as the denominator. CONTRAST:  75mL ISOVUE-370 IOPAMIDOL (ISOVUE-370) INJECTION 76% COMPARISON:  Doppler ultrasound 02/06/2017 FINDINGS: Aortic arch: Standard 3 vessel aortic arch with extensive, predominantly calcified atherosclerotic plaque. Focally prominent soft plaque projects into the lumen of the distal transverse aorta. Heavily calcified atherosclerotic plaque throughout the brachiocephalic artery results in mild stenosis near the origin. No significant subclavian artery stenosis. Right carotid system: Patent without evidence of dissection. Prominent calcified plaque at the carotid bifurcation results in less than 50% proximal ICA narrowing, although there is severe ECA origin stenosis. Widely patent distal cervical ICA. Left carotid system: Patent with 60% common carotid artery origin stenosis. Prominent calcified plaque at the carotid bifurcation results in less than 50% proximal ICA narrowing. Widely patent distal cervical ICA. Partially retropharyngeal course of the proximal ICA. Vertebral arteries: Patent with the left being dominant. Calcified plaque at the vertebral artery origins results in moderate to severe stenosis  bilaterally. There is mild scattered plaque elsewhere in the V1 and V2 segments without significant stenosis. Skeleton: Focally advanced disc degeneration at C3-4. Other neck: Asymmetrically enlarged and markedly heterogeneous left thyroid lobe compatible with goiter. Diminutive right thyroid lobe. Upper chest: Clear lung apices. IMPRESSION: 1. 60% left common carotid artery origin stenosis. 2. Calcified plaque at both carotid bifurcations without significant ICA stenosis. 3. Moderate to severe bilateral vertebral artery origin stenosis. 4. Severe right ECA origin stenosis. 5. Mild brachiocephalic artery stenosis. 6.  Aortic Atherosclerosis (ICD10-I70.0). 7. Left thyroid goiter. Electronically Signed   By: Sebastian Ache M.D.   On: 03/17/2017 16:16      Assessment/Plan 1. Bilateral carotid artery stenosis Recommend:  Given the patient's subcritical stenosis even with respect to the possible symptomatic right-sided visual loss no further invasive testing or surgery at this time.  I have had a long discussion with the patient she is not interested in surgery including stenting.  Even with this situation, given the subcritical innominate lesion I would maximize antiplatelet therapy prior to recommending intervention.  This is particularly true given her age.  The family and the patient are in agreement with this.  Rather than Plavix and aspirin they prefer a full strength aspirin every day.  Continue antiplatelet therapy as prescribed Continue management of CAD, HTN and Hyperlipidemia Healthy heart diet,  encouraged exercise at least 4 times per week Follow up in 6 months with duplex ultrasound and physical exam   I will be right there A total of 30 DJD minutes was spent with this patient and greater than 50% was spent in counseling and coordination of care with the patient.  Discussion included the treatment options for vascular disease including indications for surgery and intervention.  Also discussed  is the appropriate timing of treatment.  In addition medical therapy was discussed.  2. Essential hypertension Continue antihypertensive medications as already ordered, these medications have been reviewed and there are no changes at this time.   3. Blindness of right eye See #1  4. Chronic obstructive pulmonary disease, unspecified COPD type (HCC) Continue pulmonary medications and aerosols as already ordered, these medications have been reviewed and there are no changes at this time.      Levora Dredge, MD  03/29/2017 4:29 PM

## 2017-04-06 ENCOUNTER — Telehealth (INDEPENDENT_AMBULATORY_CARE_PROVIDER_SITE_OTHER): Payer: Self-pay | Admitting: Vascular Surgery

## 2017-04-06 NOTE — Telephone Encounter (Signed)
New Message  [name] verbalized they are needing MD's notes from 1.3.19 appt faxed to number below to attention [name].  The office is Dr. Skip EstimableBRASINGTON's office.  Fax#: [phone]

## 2017-04-06 NOTE — Telephone Encounter (Signed)
Information faxed to Dr. Skip EstimableBrasington's office. See note below.

## 2017-05-24 ENCOUNTER — Other Ambulatory Visit: Payer: Self-pay | Admitting: Family Medicine

## 2017-05-24 ENCOUNTER — Ambulatory Visit
Admission: RE | Admit: 2017-05-24 | Discharge: 2017-05-24 | Disposition: A | Discharge status: Home / Self Care | Payer: Medicare Other | Source: Ambulatory Visit | Attending: Family Medicine | Admitting: Family Medicine

## 2017-05-24 DIAGNOSIS — I10 Essential (primary) hypertension: Secondary | ICD-10-CM | POA: Insufficient documentation

## 2017-05-24 DIAGNOSIS — J449 Chronic obstructive pulmonary disease, unspecified: Secondary | ICD-10-CM | POA: Diagnosis not present

## 2017-05-24 DIAGNOSIS — R609 Edema, unspecified: Secondary | ICD-10-CM | POA: Insufficient documentation

## 2017-05-24 DIAGNOSIS — I517 Cardiomegaly: Secondary | ICD-10-CM | POA: Insufficient documentation

## 2017-05-29 ENCOUNTER — Emergency Department (HOSPITAL_COMMUNITY): Payer: Medicare Other

## 2017-05-29 ENCOUNTER — Encounter (HOSPITAL_COMMUNITY): Payer: Self-pay

## 2017-05-29 ENCOUNTER — Other Ambulatory Visit: Payer: Self-pay

## 2017-05-29 ENCOUNTER — Emergency Department (HOSPITAL_COMMUNITY)
Admission: EM | Admit: 2017-05-29 | Discharge: 2017-06-21 | Disposition: E | Payer: Medicare Other | Attending: Emergency Medicine | Admitting: Emergency Medicine

## 2017-05-29 DIAGNOSIS — Z87891 Personal history of nicotine dependence: Secondary | ICD-10-CM | POA: Insufficient documentation

## 2017-05-29 DIAGNOSIS — R0602 Shortness of breath: Secondary | ICD-10-CM | POA: Insufficient documentation

## 2017-05-29 DIAGNOSIS — N179 Acute kidney failure, unspecified: Secondary | ICD-10-CM | POA: Insufficient documentation

## 2017-05-29 DIAGNOSIS — J449 Chronic obstructive pulmonary disease, unspecified: Secondary | ICD-10-CM | POA: Insufficient documentation

## 2017-05-29 DIAGNOSIS — Z79899 Other long term (current) drug therapy: Secondary | ICD-10-CM | POA: Insufficient documentation

## 2017-05-29 DIAGNOSIS — R6520 Severe sepsis without septic shock: Secondary | ICD-10-CM | POA: Diagnosis present

## 2017-05-29 DIAGNOSIS — E039 Hypothyroidism, unspecified: Secondary | ICD-10-CM | POA: Diagnosis not present

## 2017-05-29 DIAGNOSIS — Z7982 Long term (current) use of aspirin: Secondary | ICD-10-CM | POA: Diagnosis not present

## 2017-05-29 DIAGNOSIS — A419 Sepsis, unspecified organism: Secondary | ICD-10-CM | POA: Diagnosis not present

## 2017-05-29 DIAGNOSIS — I10 Essential (primary) hypertension: Secondary | ICD-10-CM | POA: Insufficient documentation

## 2017-05-29 LAB — CBC WITH DIFFERENTIAL/PLATELET
BASOS ABS: 0 10*3/uL (ref 0.0–0.1)
Basophils Relative: 0 %
EOS ABS: 0 10*3/uL (ref 0.0–0.7)
EOS PCT: 0 %
HEMATOCRIT: 43 % (ref 36.0–46.0)
Hemoglobin: 13.1 g/dL (ref 12.0–15.0)
LYMPHS ABS: 1.1 10*3/uL (ref 0.7–4.0)
Lymphocytes Relative: 8 %
MCH: 31 pg (ref 26.0–34.0)
MCHC: 30.5 g/dL (ref 30.0–36.0)
MCV: 101.9 fL — ABNORMAL HIGH (ref 78.0–100.0)
MONO ABS: 0.1 10*3/uL (ref 0.1–1.0)
Monocytes Relative: 1 %
Neutro Abs: 12.6 10*3/uL — ABNORMAL HIGH (ref 1.7–7.7)
Neutrophils Relative %: 91 %
PLATELETS: 75 10*3/uL — AB (ref 150–400)
RBC: 4.22 MIL/uL (ref 3.87–5.11)
RDW: 17.7 % — AB (ref 11.5–15.5)
WBC: 13.8 10*3/uL — AB (ref 4.0–10.5)

## 2017-05-29 LAB — COMPREHENSIVE METABOLIC PANEL
ALT: <5 U/L — ABNORMAL LOW (ref 14–54)
ANION GAP: 28 — AB (ref 5–15)
AST: <5 U/L — ABNORMAL LOW (ref 15–41)
Albumin: 2 g/dL — ABNORMAL LOW (ref 3.5–5.0)
Alkaline Phosphatase: 141 U/L — ABNORMAL HIGH (ref 38–126)
BILIRUBIN TOTAL: 1.5 mg/dL — AB (ref 0.3–1.2)
BUN: 59 mg/dL — ABNORMAL HIGH (ref 6–20)
CALCIUM: 8.6 mg/dL — AB (ref 8.9–10.3)
CO2: 9 mmol/L — ABNORMAL LOW (ref 22–32)
Chloride: 100 mmol/L — ABNORMAL LOW (ref 101–111)
Creatinine, Ser: 2.64 mg/dL — ABNORMAL HIGH (ref 0.44–1.00)
GFR calc non Af Amer: 15 mL/min — ABNORMAL LOW (ref >60)
GFR, EST AFRICAN AMERICAN: 17 mL/min — AB (ref >60)
Glucose, Bld: 395 mg/dL — ABNORMAL HIGH (ref 65–99)
POTASSIUM: 6.3 mmol/L — AB (ref 3.5–5.1)
Sodium: 137 mmol/L (ref 135–145)
TOTAL PROTEIN: 5.5 g/dL — AB (ref 6.5–8.1)

## 2017-05-29 LAB — BRAIN NATRIURETIC PEPTIDE: B NATRIURETIC PEPTIDE 5: 613 pg/mL — AB (ref 0.0–100.0)

## 2017-05-29 LAB — URINALYSIS, ROUTINE W REFLEX MICROSCOPIC
BILIRUBIN URINE: NEGATIVE
Glucose, UA: 150 mg/dL — AB
KETONES UR: NEGATIVE mg/dL
Nitrite: NEGATIVE
Protein, ur: NEGATIVE mg/dL
SPECIFIC GRAVITY, URINE: 1.015 (ref 1.005–1.030)
SQUAMOUS EPITHELIAL / LPF: NONE SEEN
pH: 5 (ref 5.0–8.0)

## 2017-05-29 LAB — PROTIME-INR
INR: 1.26
Prothrombin Time: 15.6 seconds — ABNORMAL HIGH (ref 11.4–15.2)

## 2017-05-29 LAB — LACTIC ACID, PLASMA: Lactic Acid, Venous: 14.7 mmol/L (ref 0.5–1.9)

## 2017-05-29 LAB — TROPONIN I: TROPONIN I: 0.18 ng/mL — AB (ref <0.03)

## 2017-05-29 MED ORDER — IPRATROPIUM BROMIDE 0.02 % IN SOLN
1.0000 mg | Freq: Once | RESPIRATORY_TRACT | Status: AC
  Administered 2017-05-29: 1 mg via RESPIRATORY_TRACT
  Filled 2017-05-29: qty 5

## 2017-05-29 MED ORDER — ALBUTEROL (5 MG/ML) CONTINUOUS INHALATION SOLN
10.0000 mg/h | INHALATION_SOLUTION | Freq: Once | RESPIRATORY_TRACT | Status: AC
  Administered 2017-05-29: 10 mg/h via RESPIRATORY_TRACT
  Filled 2017-05-29: qty 20

## 2017-05-30 LAB — BLOOD GAS, VENOUS
ACID-BASE DEFICIT: 19.4 mmol/L — AB (ref 0.0–2.0)
BICARBONATE: 9.7 mmol/L — AB (ref 20.0–28.0)
FIO2: 1
Mode: POSITIVE
O2 Saturation: 92.3 %
PATIENT TEMPERATURE: 37
PH VEN: 7.06 — AB (ref 7.250–7.430)
pCO2, Ven: 32.5 mmHg — ABNORMAL LOW (ref 44.0–60.0)
pO2, Ven: 105 mmHg — ABNORMAL HIGH (ref 32.0–45.0)

## 2017-05-31 LAB — URINE CULTURE: Culture: 60000 — AB

## 2017-06-21 NOTE — ED Notes (Signed)
CRITICAL VALUE ALERT  Critical Value:  vbg ph 7.062, c02 32.5, 105, sat 92.3 bicarb 9.7, base deficit 19.4  Date & Time Notied:  1210  Provider Notified: Dr. Clarene DukeMcManus  Orders Received/Actions taken:  See chart
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
## 2017-06-21 NOTE — ED Triage Notes (Signed)
Ems reports pt is from home and was called out sob.  Reports pt lives with family.  EMS reports pt severely sob, alert and oriented x 4, diaphoretic, warm to touch, rr 50, diminished in both lower lobes.  88% on room air.  Fire dept put pt on 02 2liters, and sat increased to 90%.  EMS put pt on cpap and sat increased to 92%.  Enroute pt became altered and sat decreased in 80's.  ALso reports bp 86 systolic.  EMS says sob started on Saturday.

## 2017-06-21 NOTE — ED Notes (Addendum)
Pt having periods of apnea. Pt currently on bipap. Dr. Clarene DukeMcManus made aware and came to bedside. Orders for comfort care given. Family wants to keep pt on bipap at this time. Family at bedside.

## 2017-06-21 NOTE — ED Notes (Signed)
Pt found to have no heartbeat or respirations. Asystole on the monitor. Time of death 1232, verified by two RN's, Telford NabValerie Smith, RN [facility] and Fransico HimMeagan Devota Viruet, RN.  Dr. Clarene DukeMcManus made aware. Family at bedside and aware.

## 2017-06-21 DEATH — deceased

## 2017-10-02 ENCOUNTER — Encounter (INDEPENDENT_AMBULATORY_CARE_PROVIDER_SITE_OTHER): Payer: Medicare Other

## 2017-10-02 ENCOUNTER — Ambulatory Visit (INDEPENDENT_AMBULATORY_CARE_PROVIDER_SITE_OTHER): Payer: Medicare Other | Admitting: Vascular Surgery

## 2019-03-10 IMAGING — US US CAROTID DUPLEX BILAT
1 series · 13 of 24 positions shown · non-contrast
Comparison: None

CLINICAL DATA: [AGE] female with a history of eye changes.

Cardiovascular risk factors include hypertension
EXAM:
BILATERAL CAROTID DUPLEX ULTRASOUND
TECHNIQUE: Gray scale imaging, color Doppler and duplex ultrasound were
performed of bilateral carotid and vertebral arteries in the neck.

[Series 1: us carotid duplex bilat · 0.06mm/px · 13 of 69 slices shown]
[im 1/69]
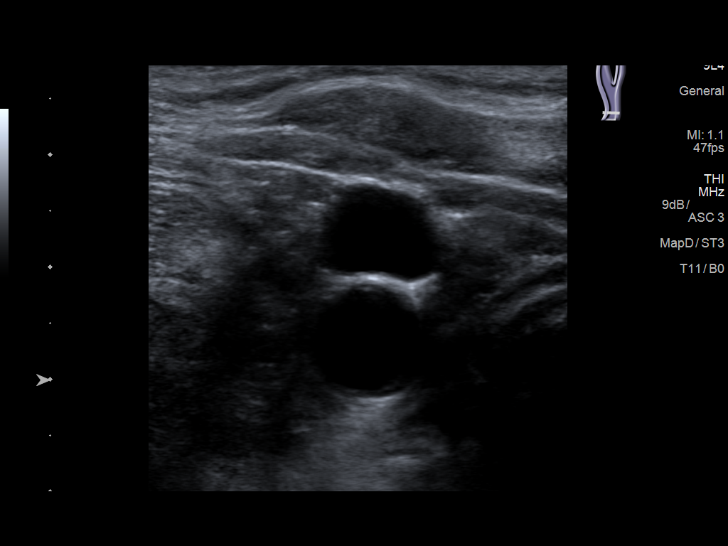
[im 6/69]
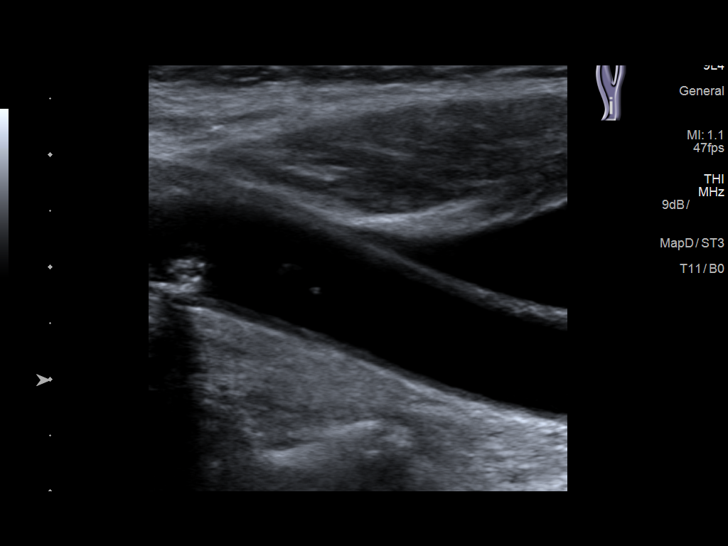
[im 12/69]
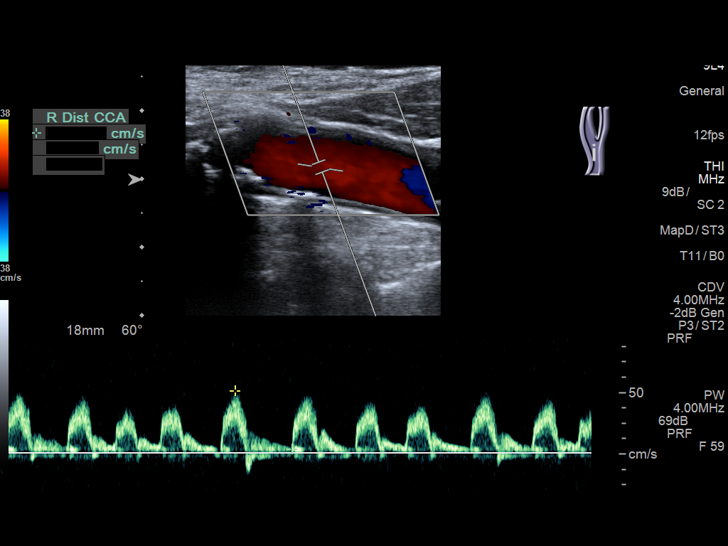
[im 18/69]
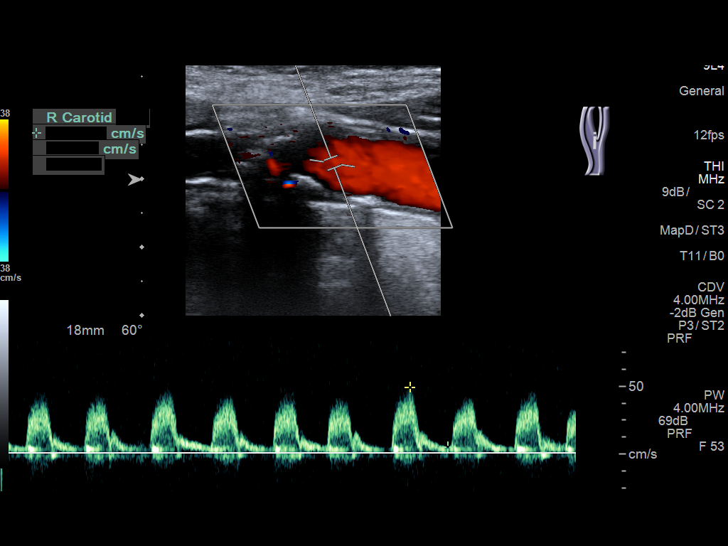
[im 24/69]
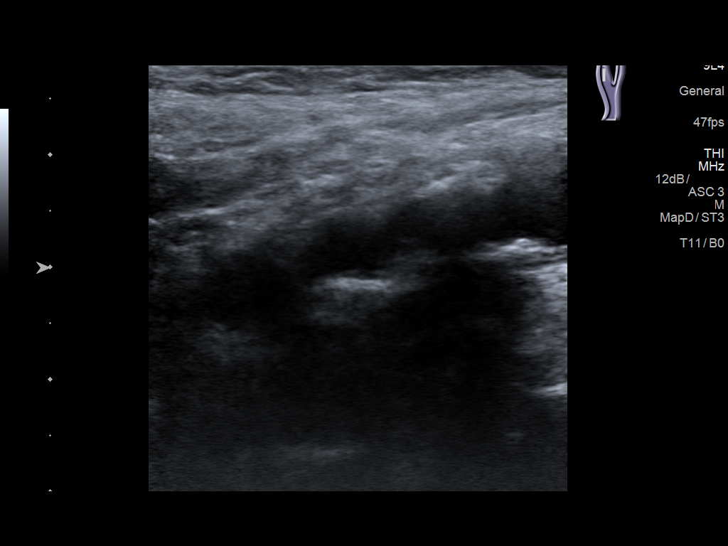
[im 30/69]
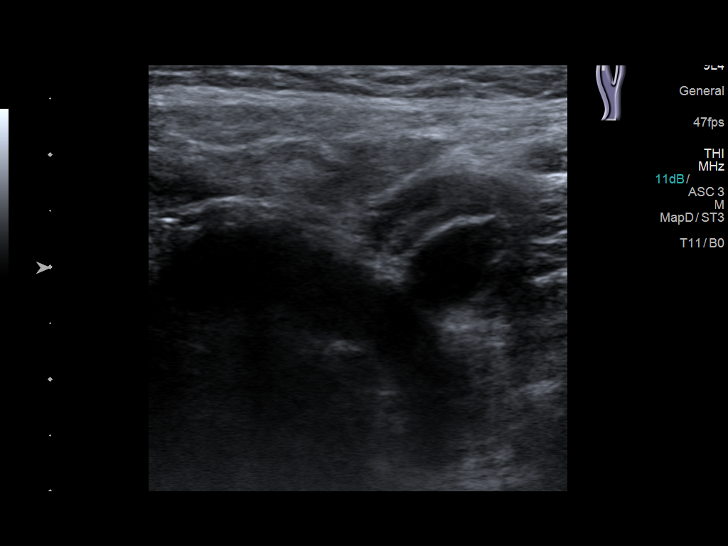
[im 36/69]
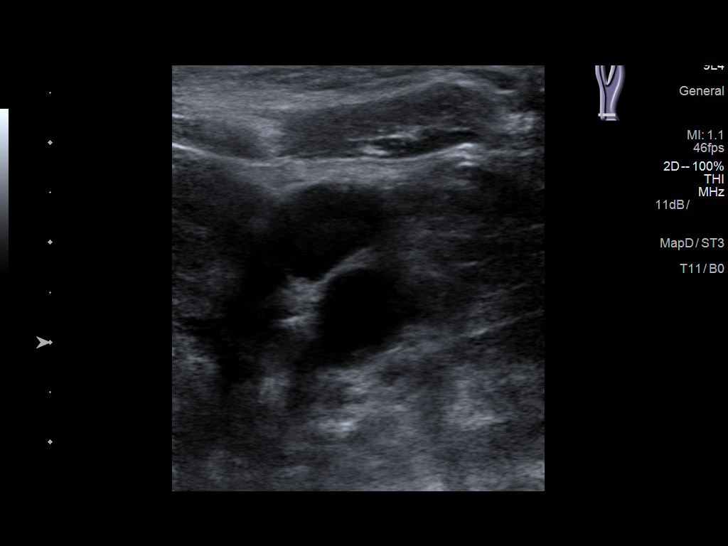
[im 39/69]
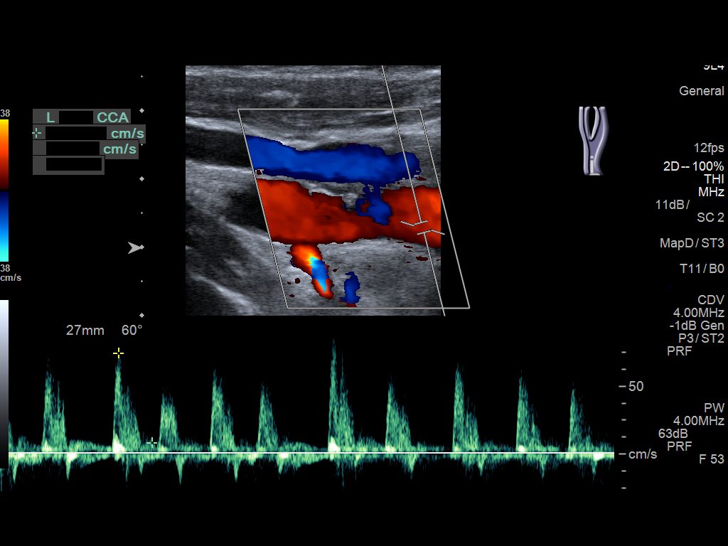
[im 45/69]
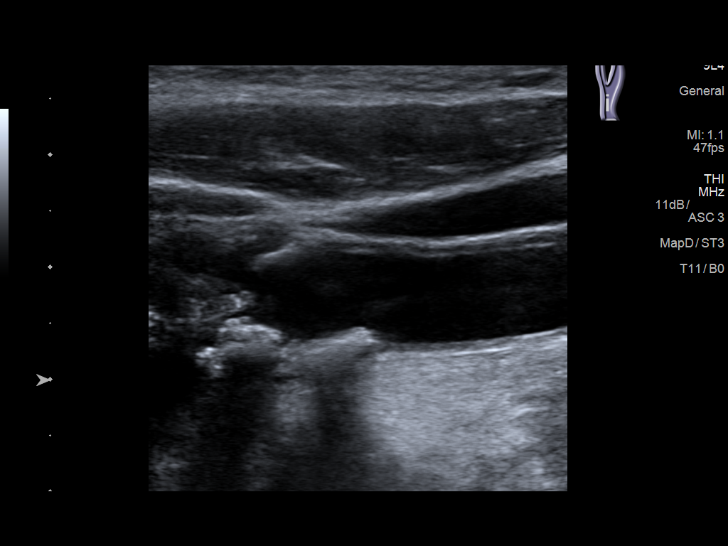
[im 51/69]
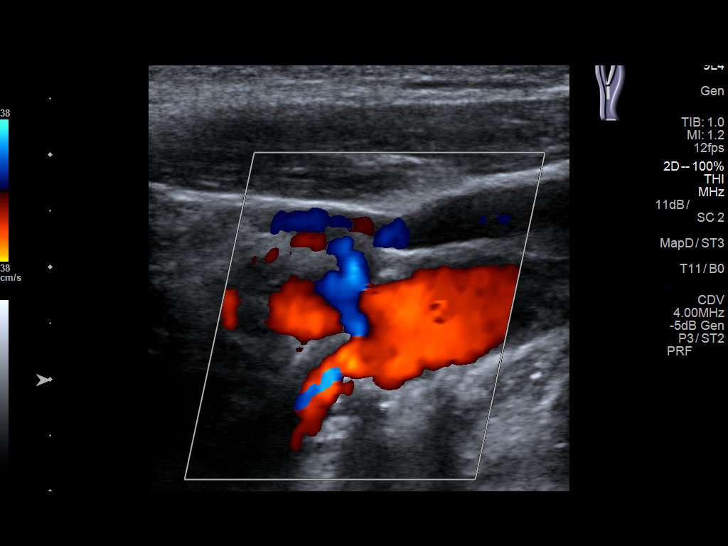
[im 57/69]
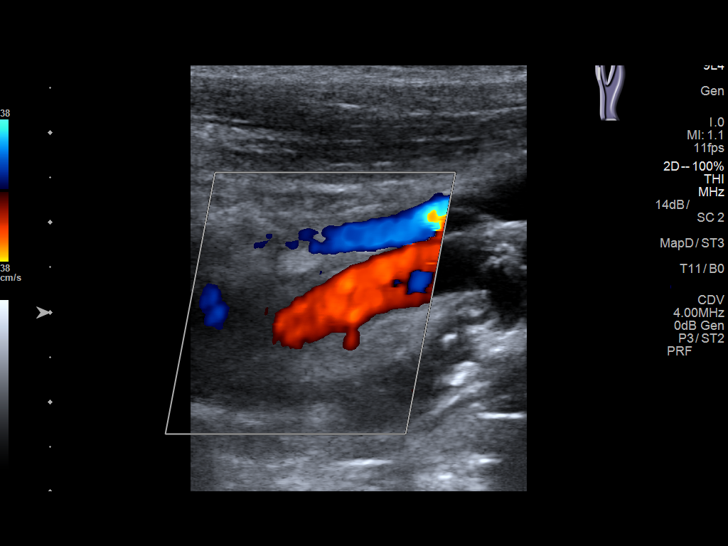
[im 63/69]
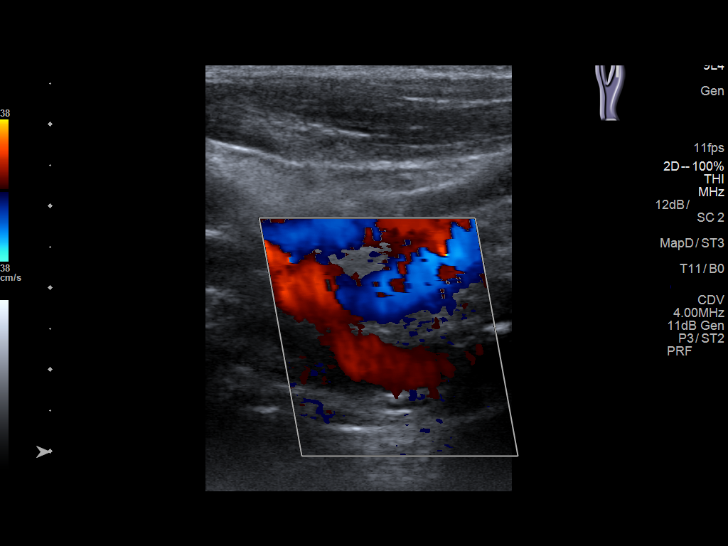
[im 69/69]
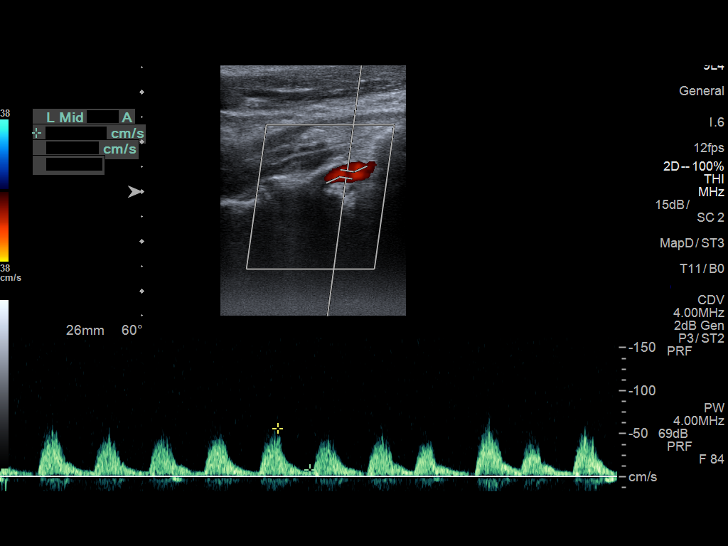

[13 of 24 positions shown; findings below may reference images not displayed]

FINDINGS: Criteria: Quantification of carotid stenosis is based on velocity
parameters that correlate the residual internal carotid diameter
with NASCET-based stenosis levels, using the diameter of the distal
internal carotid lumen as the denominator for stenosis measurement.

The following velocity measurements were obtained:

RIGHT

ICA:  Systolic 97 cm/sec, Diastolic 19 cm/sec

CCA:  50 cm/sec

SYSTOLIC ICA/CCA RATIO:

ECA:  276 cm/sec

LEFT

ICA:  Systolic 94 cm/sec, Diastolic 15 cm/sec

CCA:  84 cm/sec

SYSTOLIC ICA/CCA RATIO:

ECA:  187 cm/sec

Right Brachial SBP: Not acquired

Left Brachial SBP: Not acquired

RIGHT CAROTID ARTERY: No significant calcifications of the right
common carotid artery. Intermediate waveform maintained.
Heterogeneous and partially calcified plaque at the right carotid
bifurcation. Lumen shadowing. Low resistance waveform of the right
ICA. No significant tortuosity.

RIGHT VERTEBRAL ARTERY: Antegrade flow with low resistance waveform.

LEFT CAROTID ARTERY: No significant calcifications of the left
common carotid artery. Intermediate waveform maintained.
Heterogeneous and partially calcified plaque at the left carotid
bifurcation with lumen shadowing. Low resistance waveform of the
left ICA. No significant tortuosity.

LEFT VERTEBRAL ARTERY:  Antegrade flow with low resistance waveform.
IMPRESSION: Right:

Heterogeneous and partially calcified plaque at the right carotid
bifurcation, with discordant results regarding degree of stenosis by
established duplex criteria. Peak velocity suggests no significant
stenosis, with the ICA/ CCA ratio suggesting 50% -69% stenosis.
Also, note that velocities of the right ICA were obtained from an
area distal to the maximum narrowing due to the presence of anterior
wall plaque with shadowing and may be underestimating the percentage
of ICA stenosis.

If establishing a more accurate degree of stenosis is required,
cerebral angiogram should be considered, or as a second best test,
CTA.

Left:

Heterogeneous and partially calcified plaque at the left carotid
bifurcation, with no significant stenosis by established duplex
criteria. Note that flow velocities of the left ICA were obtained
from an area distal to the maximum narrowing due to the presence of
anterior wall plaque with shadowing and may be underestimating the
percentage of ICA stenosis. If establishing a more accurate degree
of stenosis is required, cerebral angiogram should be considered, or
as a second best test, CTA.

## 2019-07-15 IMAGING — CT CT ANGIO NECK
3 of 8 series · 9 of 36 positions shown · IV contrast (iopamidol)
Comparison: Doppler ultrasound 02/06/2017

CLINICAL DATA: Carotid artery stenosis.

EXAM:
CT ANGIOGRAPHY NECK
TECHNIQUE: Multidetector CT imaging of the neck was performed using the
standard protocol during bolus administration of intravenous
contrast. Multiplanar CT image reconstructions and MIPs were
obtained to evaluate the vascular anatomy. Carotid stenosis
measurements (when applicable) are obtained utilizing NASCET
criteria, using the distal internal carotid diameter as the
denominator.
CONTRAST:  75mL ROAD7D-2R5 IOPAMIDOL (ROAD7D-2R5) INJECTION 76%

[Series 5: cta neck 1.00 bv36 s3 · axial · 0.44mm/px · z∈[-781,-602]mm · 5 of 530 slices shown]
[im 89/530  soft-tissue]
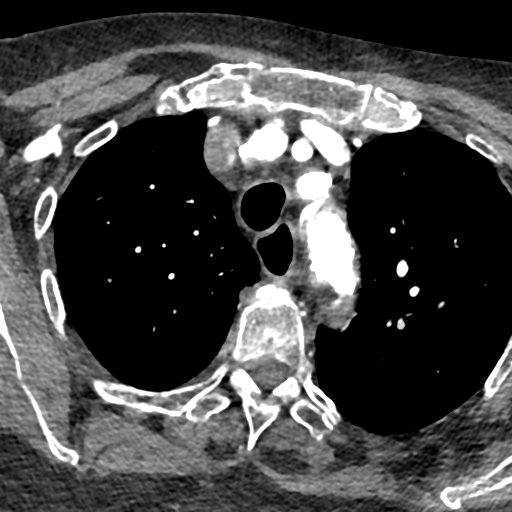
[im 177/530  bone]
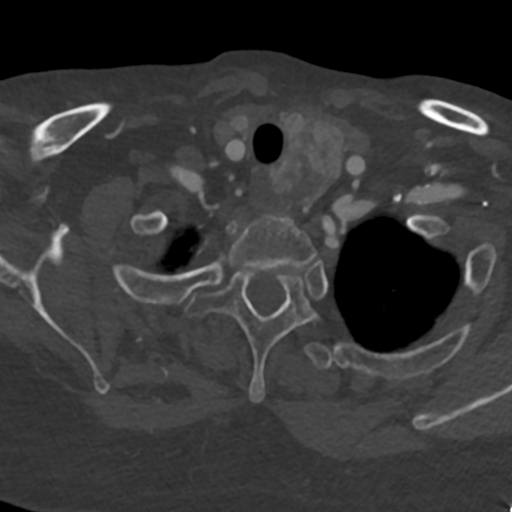
[im 265/530  soft-tissue]
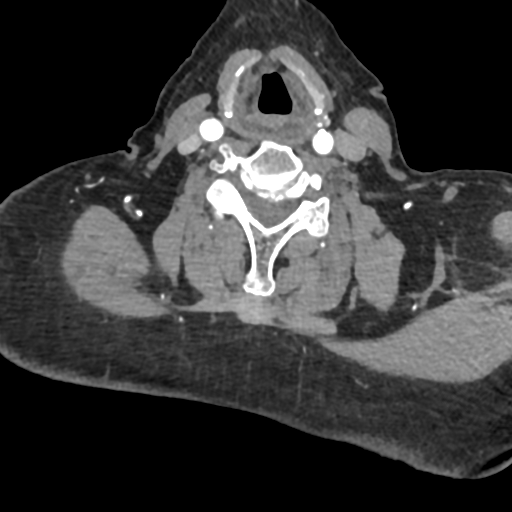
[im 353/530  bone]
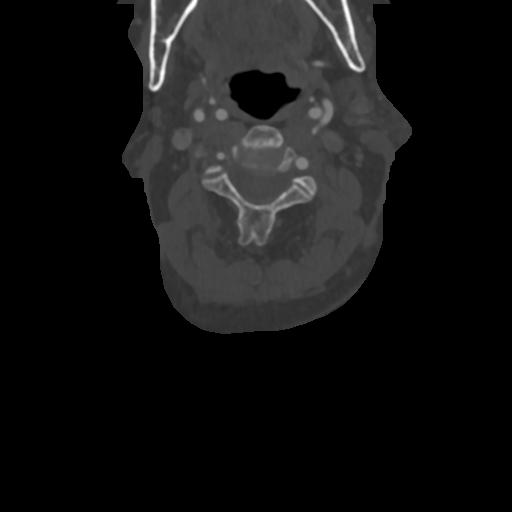
[im 441/530  soft-tissue]
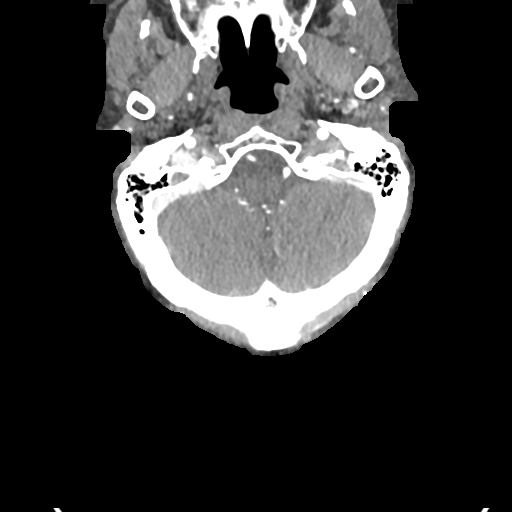

[Series 8: cta neck 2.00 bv36 s3 ax · axial · 0.44mm/px · z∈[-745,-656]mm · 2 of 268 slices shown]
[im 90/268  soft-tissue]
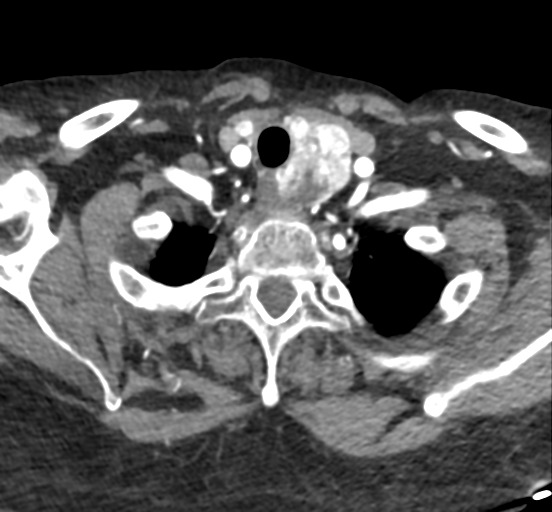
[im 179/268  soft-tissue]
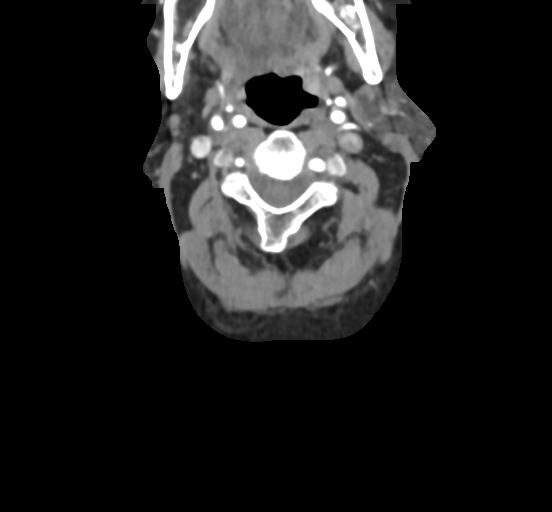

[Series 12: cta neck 2.00 bv36 s3 sag · sagittal · 0.44mm/px · 2 of 241 slices shown]
[im 48/241  soft-tissue]
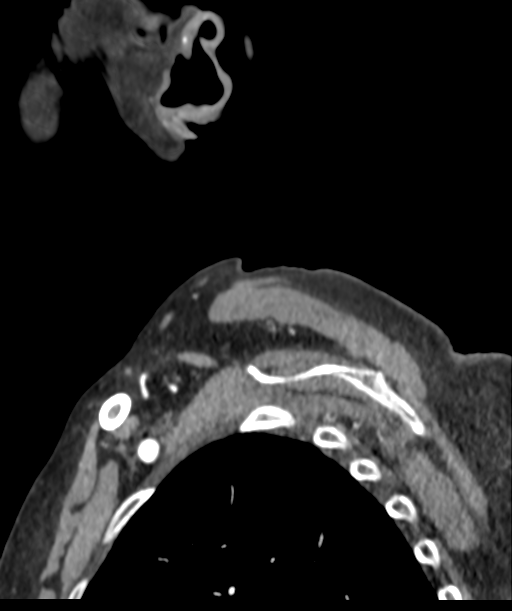
[im 194/241  soft-tissue]
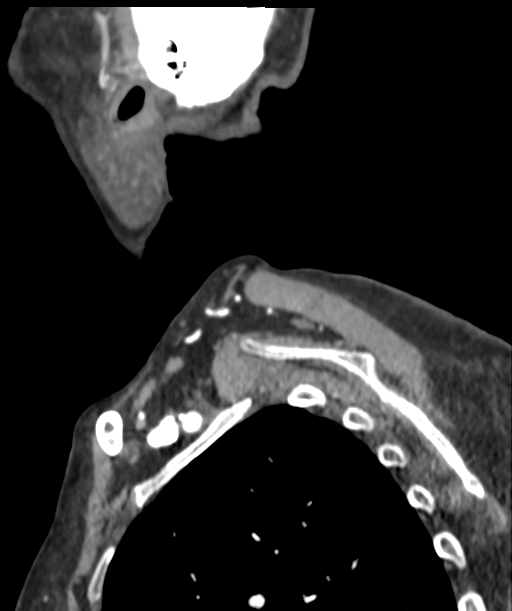

[9 of 36 positions shown; findings below may reference images not displayed]

FINDINGS: Aortic arch: Standard 3 vessel aortic arch with extensive,
predominantly calcified atherosclerotic plaque. Focally prominent
soft plaque projects into the lumen of the distal transverse aorta.
Heavily calcified atherosclerotic plaque throughout the
brachiocephalic artery results in mild stenosis near the origin. No
significant subclavian artery stenosis.

Right carotid system: Patent without evidence of dissection.
Prominent calcified plaque at the carotid bifurcation results in
less than 50% proximal ICA narrowing, although there is severe ECA
origin stenosis. Widely patent distal cervical ICA.

Left carotid system: Patent with 60% common carotid artery origin
stenosis. Prominent calcified plaque at the carotid bifurcation
results in less than 50% proximal ICA narrowing. Widely patent
distal cervical ICA. Partially retropharyngeal course of the
proximal ICA.

Vertebral arteries: Patent with the left being dominant. Calcified
plaque at the vertebral artery origins results in moderate to severe
stenosis bilaterally. There is mild scattered plaque elsewhere in
the V1 and V2 segments without significant stenosis.

Skeleton: Focally advanced disc degeneration at C3-4.

Other neck: Asymmetrically enlarged and markedly heterogeneous left
thyroid lobe compatible with goiter. Diminutive right thyroid lobe.

Upper chest: Clear lung apices.
IMPRESSION: 1. 60% left common carotid artery origin stenosis.
2. Calcified plaque at both carotid bifurcations without significant
ICA stenosis.
3. Moderate to severe bilateral vertebral artery origin stenosis.
4. Severe right ECA origin stenosis.
5. Mild brachiocephalic artery stenosis.
6.  Aortic Atherosclerosis ([id number]).
7. Left thyroid goiter.

## 2019-09-21 IMAGING — CR DG CHEST 2V
1 series · 2 of 2 positions shown · non-contrast
Comparison: None

CLINICAL DATA: BILATERAL foot edema for 1 month, history
hypertension, former smoker

EXAM:
CHEST - 2 VIEW

[Series 1: dg chest 2 view · 0.14mm/px · 2 of 2 slices shown]
[im 1/2]
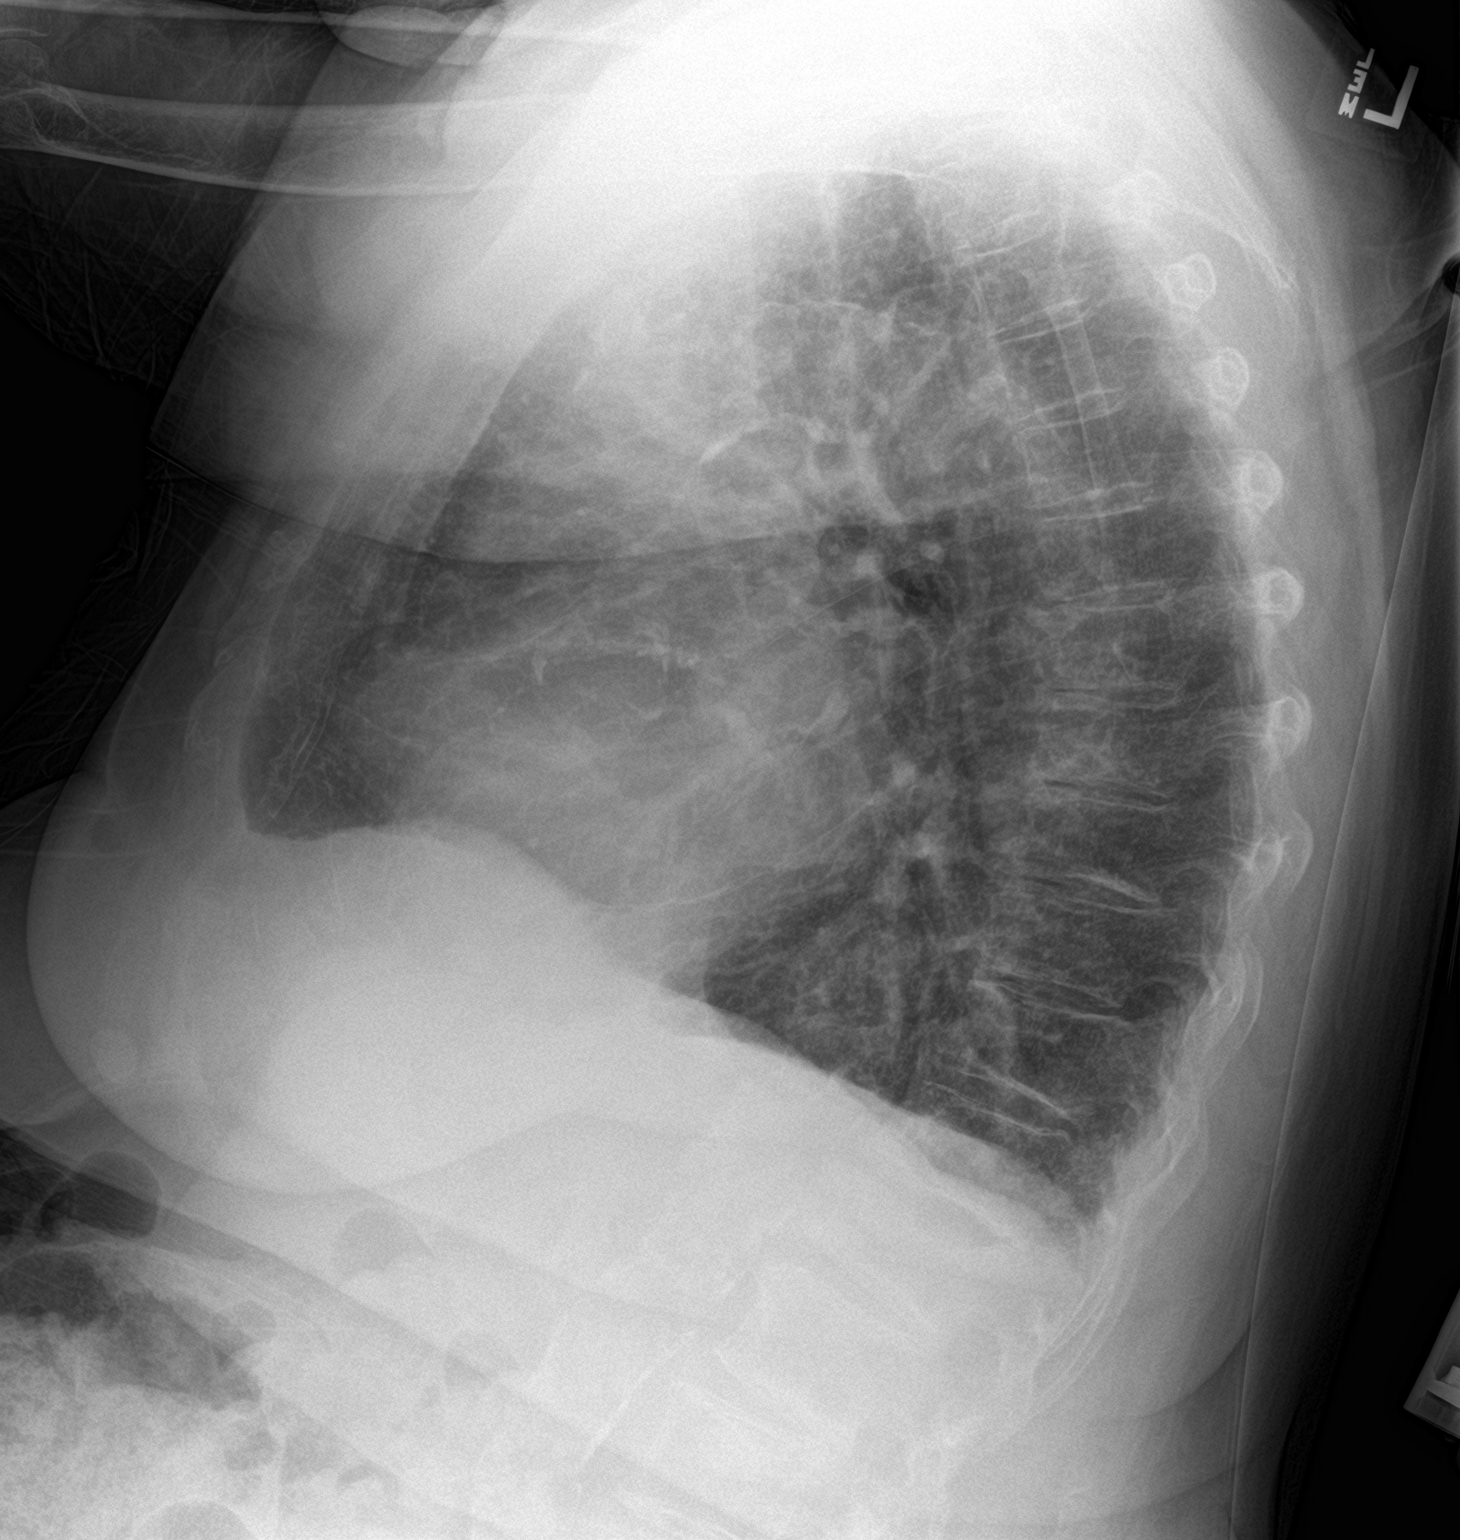
[im 2/2]
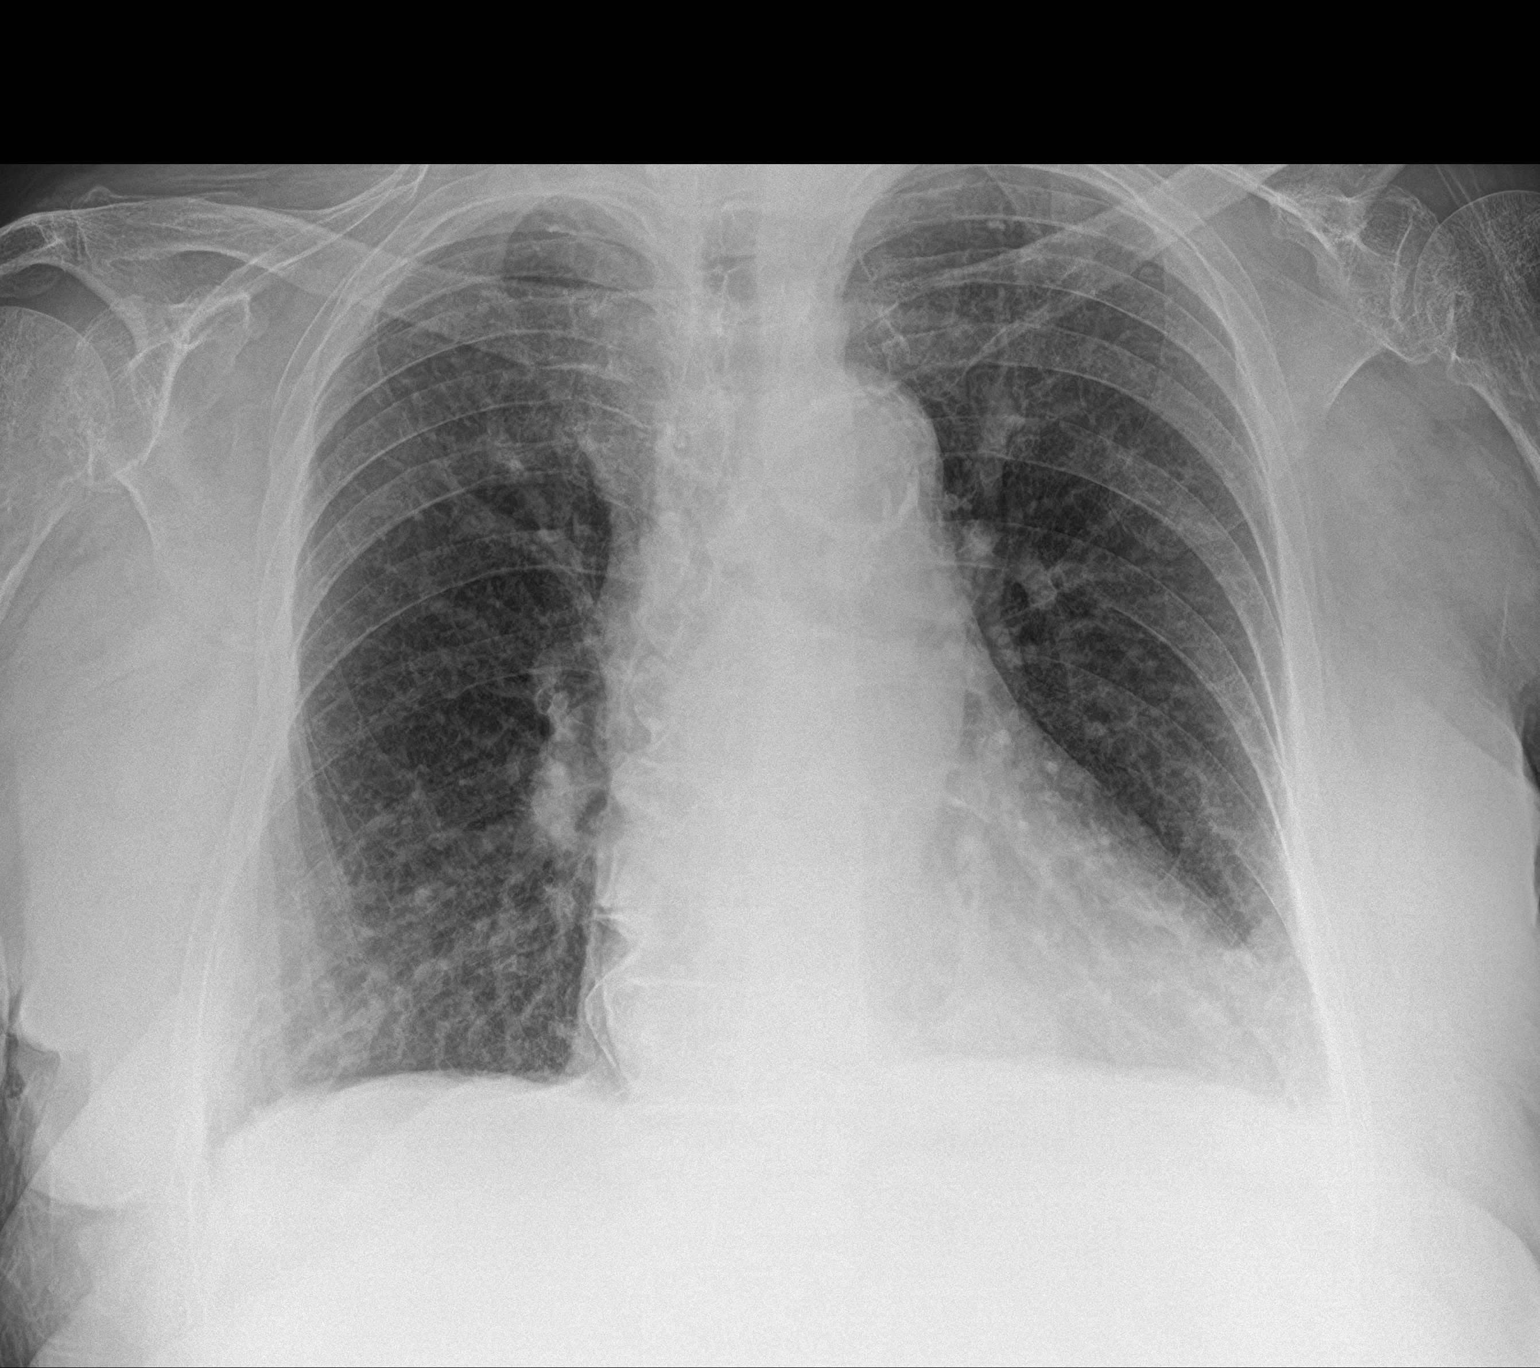

[2 of 2 positions shown; findings below may reference images not displayed]

FINDINGS: Enlargement of cardiac silhouette with slight pulmonary vascular
congestion.

Mediastinal contours normal.

Atherosclerotic calcification aorta.

Emphysematous changes without infiltrate, pleural effusion or
pneumothorax.

Bones demineralized with chronic compression deformity of the L1
vertebral body.
IMPRESSION: Enlargement of cardiac silhouette with slight pulmonary vascular
congestion.

COPD changes without acute infiltrate.

## 2019-09-26 IMAGING — CR DG CHEST 1V PORT
1 series · 1 of 1 positions shown · non-contrast
Comparison: 05/24/2017

CLINICAL DATA: Shortness of breath, fever, COPD

EXAM:
PORTABLE CHEST 1 VIEW

[portable]
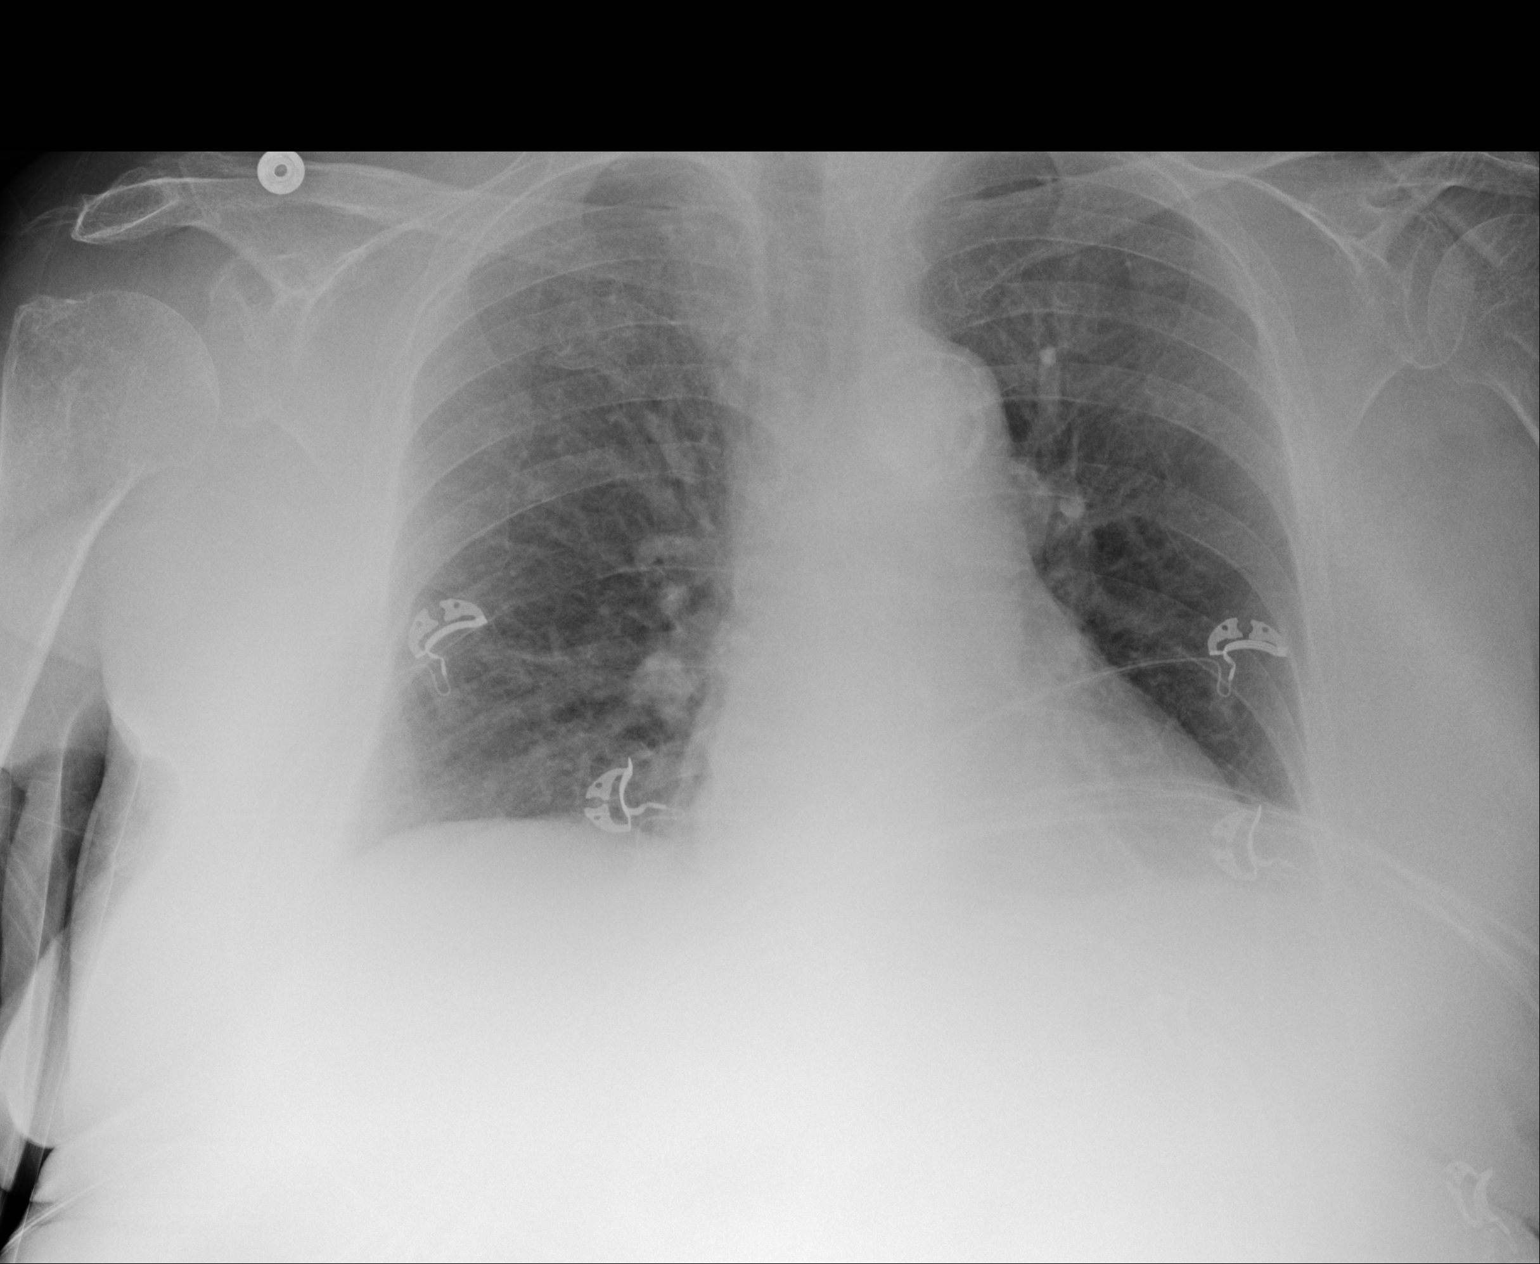

[1 of 1 positions shown; findings below may reference images not displayed]

FINDINGS: Heart is upper limits normal in size. No confluent airspace
opacities or effusions. No acute bony abnormality. Degenerative
changes in the shoulders.
IMPRESSION: No acute findings.
